# Patient Record
Sex: Female | Born: 1955
Health system: Southern US, Community
[De-identification: ages and names within clinical notes are randomized; demographics above are authoritative.]

## PROBLEM LIST (undated history)

## (undated) DIAGNOSIS — E7800 Pure hypercholesterolemia, unspecified: Secondary | ICD-10-CM

## (undated) DIAGNOSIS — J302 Other seasonal allergic rhinitis: Secondary | ICD-10-CM

## (undated) DIAGNOSIS — F329 Major depressive disorder, single episode, unspecified: Secondary | ICD-10-CM

## (undated) DIAGNOSIS — F419 Anxiety disorder, unspecified: Secondary | ICD-10-CM

## (undated) DIAGNOSIS — G4700 Insomnia, unspecified: Secondary | ICD-10-CM

## (undated) DIAGNOSIS — F32A Depression, unspecified: Secondary | ICD-10-CM

## (undated) HISTORY — PX: WRIST SURGERY: SHX841

## (undated) HISTORY — PX: ABDOMINAL HYSTERECTOMY: SHX81

## (undated) HISTORY — DX: Depression, unspecified: F32.A

## (undated) HISTORY — PX: COLONOSCOPY: SHX174

## (undated) HISTORY — DX: Anxiety disorder, unspecified: F41.9

## (undated) HISTORY — PX: REFRACTIVE SURGERY: SHX103

## (undated) HISTORY — DX: Pure hypercholesterolemia, unspecified: E78.00

## (undated) HISTORY — DX: Insomnia, unspecified: G47.00

## (undated) HISTORY — PX: BREAST EXCISIONAL BIOPSY: SUR124

## (undated) HISTORY — DX: Major depressive disorder, single episode, unspecified: F32.9

## (undated) HISTORY — PX: OTHER SURGICAL HISTORY: SHX169

---

## 1997-12-14 ENCOUNTER — Other Ambulatory Visit: Admission: RE | Admit: 1997-12-14 | Discharge: 1997-12-14 | Payer: Self-pay | Admitting: Obstetrics and Gynecology

## 1998-05-08 ENCOUNTER — Ambulatory Visit (HOSPITAL_COMMUNITY): Admission: RE | Admit: 1998-05-08 | Discharge: 1998-05-08 | Payer: Self-pay | Admitting: Surgery

## 1998-12-19 ENCOUNTER — Other Ambulatory Visit: Admission: RE | Admit: 1998-12-19 | Discharge: 1998-12-19 | Payer: Self-pay | Admitting: *Deleted

## 1999-12-25 ENCOUNTER — Other Ambulatory Visit: Admission: RE | Admit: 1999-12-25 | Discharge: 1999-12-25 | Payer: Self-pay | Admitting: Obstetrics & Gynecology

## 2000-01-07 ENCOUNTER — Encounter: Payer: Self-pay | Admitting: Obstetrics & Gynecology

## 2000-01-07 ENCOUNTER — Encounter: Admission: RE | Admit: 2000-01-07 | Discharge: 2000-01-07 | Payer: Self-pay | Admitting: Obstetrics & Gynecology

## 2001-01-01 ENCOUNTER — Other Ambulatory Visit: Admission: RE | Admit: 2001-01-01 | Discharge: 2001-01-01 | Payer: Self-pay | Admitting: Obstetrics & Gynecology

## 2001-01-12 ENCOUNTER — Encounter: Payer: Self-pay | Admitting: Obstetrics & Gynecology

## 2001-01-12 ENCOUNTER — Encounter: Admission: RE | Admit: 2001-01-12 | Discharge: 2001-01-12 | Payer: Self-pay | Admitting: Obstetrics & Gynecology

## 2002-01-04 ENCOUNTER — Other Ambulatory Visit: Admission: RE | Admit: 2002-01-04 | Discharge: 2002-01-04 | Payer: Self-pay | Admitting: Obstetrics & Gynecology

## 2002-01-12 ENCOUNTER — Ambulatory Visit (HOSPITAL_COMMUNITY): Admission: RE | Admit: 2002-01-12 | Discharge: 2002-01-12 | Payer: Self-pay | Admitting: Family Medicine

## 2002-01-12 ENCOUNTER — Encounter: Payer: Self-pay | Admitting: Family Medicine

## 2002-01-14 ENCOUNTER — Encounter: Payer: Self-pay | Admitting: Obstetrics & Gynecology

## 2002-01-14 ENCOUNTER — Encounter: Admission: RE | Admit: 2002-01-14 | Discharge: 2002-01-14 | Payer: Self-pay | Admitting: Obstetrics & Gynecology

## 2002-03-23 ENCOUNTER — Ambulatory Visit (HOSPITAL_COMMUNITY): Admission: RE | Admit: 2002-03-23 | Discharge: 2002-03-23 | Payer: Self-pay | Admitting: Internal Medicine

## 2002-08-31 ENCOUNTER — Ambulatory Visit (HOSPITAL_COMMUNITY): Admission: RE | Admit: 2002-08-31 | Discharge: 2002-08-31 | Payer: Self-pay | Admitting: Family Medicine

## 2002-08-31 ENCOUNTER — Encounter: Payer: Self-pay | Admitting: Family Medicine

## 2003-01-17 ENCOUNTER — Encounter: Admission: RE | Admit: 2003-01-17 | Discharge: 2003-01-17 | Payer: Self-pay | Admitting: Obstetrics & Gynecology

## 2003-01-17 ENCOUNTER — Encounter: Payer: Self-pay | Admitting: Obstetrics & Gynecology

## 2003-01-23 ENCOUNTER — Other Ambulatory Visit: Admission: RE | Admit: 2003-01-23 | Discharge: 2003-01-23 | Payer: Self-pay | Admitting: Obstetrics & Gynecology

## 2003-08-11 ENCOUNTER — Encounter: Admission: RE | Admit: 2003-08-11 | Discharge: 2003-08-11 | Payer: Self-pay | Admitting: Family Medicine

## 2004-01-30 ENCOUNTER — Encounter: Admission: RE | Admit: 2004-01-30 | Discharge: 2004-01-30 | Payer: Self-pay | Admitting: Obstetrics & Gynecology

## 2004-02-12 ENCOUNTER — Encounter: Admission: RE | Admit: 2004-02-12 | Discharge: 2004-02-12 | Payer: Self-pay | Admitting: Obstetrics & Gynecology

## 2004-05-15 ENCOUNTER — Ambulatory Visit (HOSPITAL_COMMUNITY): Admission: RE | Admit: 2004-05-15 | Discharge: 2004-05-15 | Payer: Self-pay | Admitting: Internal Medicine

## 2004-08-06 ENCOUNTER — Ambulatory Visit (HOSPITAL_COMMUNITY): Admission: RE | Admit: 2004-08-06 | Discharge: 2004-08-06 | Payer: Self-pay | Admitting: Family Medicine

## 2005-01-31 ENCOUNTER — Encounter: Admission: RE | Admit: 2005-01-31 | Discharge: 2005-01-31 | Payer: Self-pay | Admitting: Obstetrics & Gynecology

## 2006-03-02 ENCOUNTER — Encounter: Admission: RE | Admit: 2006-03-02 | Discharge: 2006-03-02 | Payer: Self-pay | Admitting: Obstetrics & Gynecology

## 2006-12-02 ENCOUNTER — Encounter (INDEPENDENT_AMBULATORY_CARE_PROVIDER_SITE_OTHER): Payer: Self-pay | Admitting: Urology

## 2006-12-02 ENCOUNTER — Ambulatory Visit (HOSPITAL_BASED_OUTPATIENT_CLINIC_OR_DEPARTMENT_OTHER): Admission: RE | Admit: 2006-12-02 | Discharge: 2006-12-02 | Payer: Self-pay | Admitting: Urology

## 2007-03-10 ENCOUNTER — Encounter: Admission: RE | Admit: 2007-03-10 | Discharge: 2007-03-10 | Payer: Self-pay | Admitting: Obstetrics & Gynecology

## 2007-03-18 ENCOUNTER — Encounter: Admission: RE | Admit: 2007-03-18 | Discharge: 2007-03-18 | Payer: Self-pay | Admitting: Obstetrics & Gynecology

## 2007-04-26 ENCOUNTER — Encounter (HOSPITAL_COMMUNITY): Admission: RE | Admit: 2007-04-26 | Discharge: 2007-05-26 | Payer: Self-pay | Admitting: Family Medicine

## 2007-08-27 ENCOUNTER — Encounter: Admission: RE | Admit: 2007-08-27 | Discharge: 2007-08-27 | Payer: Self-pay | Admitting: Obstetrics & Gynecology

## 2008-03-13 ENCOUNTER — Encounter: Admission: RE | Admit: 2008-03-13 | Discharge: 2008-03-13 | Payer: Self-pay | Admitting: Obstetrics and Gynecology

## 2008-07-01 IMAGING — NM NM HEPATO W/GB/PHARM/[PERSON_NAME]
2 series · 12 of 12 positions shown · non-contrast
Comparison: none

HISTORY: Abdominal pain

[Series 1: hepatobiliary · 3.20mm/px · 6 of 60 frames shown (1 of 2)]
[frame 6/60]
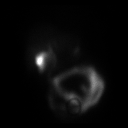
[frame 16/60]
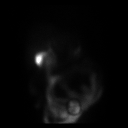
[frame 26/60]
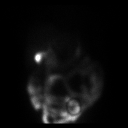
[frame 36/60]
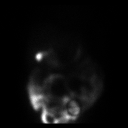
[frame 46/60]
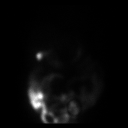
[frame 56/60]
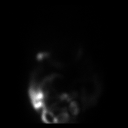

[Series 1: hepatobiliary · 3.20mm/px · 6 of 60 frames shown (2 of 2)]
[frame 6/60]
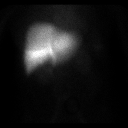
[frame 16/60]
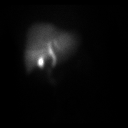
[frame 26/60]
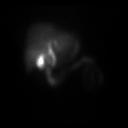
[frame 36/60]
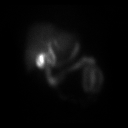
[frame 46/60]
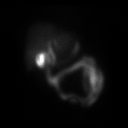
[frame 56/60]
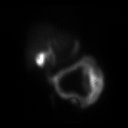

[12 of 12 positions shown; findings below may reference images not displayed]

HEPATOBILIARY SCAN WITH EJECTION FRACTION:

Hepatobiliary imaging performed using 5 mCi 3c-LLm mebrofenin.

Prompt tracer extraction from blood stream, indicating normal hepatocellular
function.
Prompt excretion of tracer into biliary tree.
Gallbladder visualized x 11 minutes.
Small bowel activity identified x 21 minutes.
No focal hepatic retention of tracer.
Mild duodenogastric reflux of tracer.

At 1 hour, patient ingested half-and-half, and imaging was continued for 60
minutes.
Normal emptying of tracer occurs from gallbladder following fatty meal
stimulation.
Calculated gallbladder ejection fraction 72%, normal.
IMPRESSION: Patent biliary tree.
Normal gallbladder ejection fraction of 72% following fatty meal stimulation.
Duodenogastric reflux of bile.

## 2009-03-14 ENCOUNTER — Encounter: Admission: RE | Admit: 2009-03-14 | Discharge: 2009-03-14 | Payer: Self-pay | Admitting: Obstetrics and Gynecology

## 2010-02-05 ENCOUNTER — Ambulatory Visit (HOSPITAL_COMMUNITY): Admission: RE | Admit: 2010-02-05 | Discharge: 2010-02-05 | Payer: Self-pay | Admitting: Urology

## 2010-03-19 ENCOUNTER — Encounter: Admission: RE | Admit: 2010-03-19 | Discharge: 2010-03-19 | Payer: Self-pay | Admitting: Obstetrics and Gynecology

## 2010-06-16 ENCOUNTER — Encounter: Payer: Self-pay | Admitting: Obstetrics & Gynecology

## 2010-10-08 NOTE — Op Note (Signed)
Kelly Scott, Kelly Scott               ACCOUNT NO.:  [id number]   MEDICAL RECORD NO.:  [id number]          PATIENT TYPE:  AMB   LOCATION:  [facility]                         FACILITY:  [facility]   PHYSICIAN:  Ronald L. Earlene Plater, M.D.  DATE OF BIRTH:  02-Feb-1956   DATE OF PROCEDURE:  12/02/2006  DATE OF DISCHARGE:                               OPERATIVE REPORT   PREOPERATIVE DIAGNOSIS:  Questionable interstitial cystitis,  preoperatively.   POSTOPERATIVE DIAGNOSIS:  Probable interstitial cystitis plus  urethritis.   PROCEDURE:  Cystourethroscopy, urethral dilatation, hydraulic bladder  distension, and bladder biopsy.   SURGEON:  Lucrezia Starch. Earlene Plater, M.D.   ANESTHESIA:  LMA.   BLOOD LOSS:  Negligible.   TUBES:  None.   FINDINGS:  She had a capacity of 650 mL at 80 cmH2O pressure and  glomerulations noted.  She also had a tight urethritis.   INDICATIONS FOR PROCEDURE:  Kelly Scott is a lovely 55 year old white  female who basically presents with progressive urgency, frequency,  dysuria, back pain and decreased energy. She has undergone a workup but  with her progressive symptoms we have been quite suspicious that she has  interstitial cystitis.  She has been on Pyridium without much relief and  is now currently on Urel and after understanding risks, benefits and  alternatives, has elected proceed the above procedure.   PROCEDURE IN DETAIL:  The patient was placed in the supine position,  proper LMA anesthesia, was placed in the dorsal lithotomy position,  prepped and draped with Betadine in a sterile fashion.  The urethra was  noted to be quite tight but it was calibrated to 90 [place] with female  sounds.  Cystourethroscopy was performed.  The bladder was smooth walled  and efflux of clear urine was noted from normally placed ureteral  orifices bilaterally.  There was significant circumferential urethritis.  Hydraulic bladder distension was then performed to 80 cmH2O.  She had a  capacity of 650  mL and when it was relieved, there was some slight  bleeding on the right side of the bladder and mainly submucosal  glomerulations.  Biopsies were taken from the posterior midline, cold  cup, and submitted to  pathology.  The base was cauterized with Bovie coagulation cautery.  Reinspection revealed there were no further lesions.  The bladder was  drained.  The panendoscope was removed.  The patient was taken to the  recovery room stable.      Ronald L. Earlene Plater, M.D.  Electronically Signed     RLD/MEDQ  D:  12/02/2006  T:  12/03/2006  Job:  [id number]

## 2011-02-12 ENCOUNTER — Other Ambulatory Visit: Payer: Self-pay | Admitting: Obstetrics & Gynecology

## 2011-02-12 DIAGNOSIS — Z1231 Encounter for screening mammogram for malignant neoplasm of breast: Secondary | ICD-10-CM

## 2011-03-11 ENCOUNTER — Encounter (INDEPENDENT_AMBULATORY_CARE_PROVIDER_SITE_OTHER): Payer: Self-pay | Admitting: General Surgery

## 2011-03-11 LAB — POCT HEMOGLOBIN-HEMACUE
Hemoglobin: 13
Operator id: 114531

## 2011-03-25 ENCOUNTER — Ambulatory Visit: Payer: Self-pay

## 2011-04-15 ENCOUNTER — Ambulatory Visit
Admission: RE | Admit: 2011-04-15 | Discharge: 2011-04-15 | Disposition: A | Discharge status: Home / Self Care | Payer: 59 | Source: Ambulatory Visit | Attending: Obstetrics & Gynecology | Admitting: Obstetrics & Gynecology

## 2011-04-15 DIAGNOSIS — Z1231 Encounter for screening mammogram for malignant neoplasm of breast: Secondary | ICD-10-CM

## 2011-05-13 ENCOUNTER — Other Ambulatory Visit: Payer: Self-pay | Admitting: Dermatology

## 2011-08-04 ENCOUNTER — Encounter (INDEPENDENT_AMBULATORY_CARE_PROVIDER_SITE_OTHER): Payer: Self-pay | Admitting: General Surgery

## 2011-09-08 ENCOUNTER — Other Ambulatory Visit (HOSPITAL_COMMUNITY): Payer: Self-pay | Admitting: Cardiovascular Disease

## 2011-09-08 DIAGNOSIS — R7989 Other specified abnormal findings of blood chemistry: Secondary | ICD-10-CM

## 2011-09-08 DIAGNOSIS — R945 Abnormal results of liver function studies: Secondary | ICD-10-CM

## 2011-09-15 ENCOUNTER — Ambulatory Visit (HOSPITAL_COMMUNITY)
Admission: RE | Admit: 2011-09-15 | Discharge: 2011-09-15 | Disposition: A | Discharge status: Home / Self Care | Payer: 59 | Source: Ambulatory Visit | Attending: Cardiovascular Disease | Admitting: Cardiovascular Disease

## 2011-09-15 DIAGNOSIS — R748 Abnormal levels of other serum enzymes: Secondary | ICD-10-CM | POA: Insufficient documentation

## 2011-09-15 DIAGNOSIS — R945 Abnormal results of liver function studies: Secondary | ICD-10-CM

## 2011-09-15 DIAGNOSIS — K769 Liver disease, unspecified: Secondary | ICD-10-CM | POA: Insufficient documentation

## 2011-11-04 ENCOUNTER — Encounter (INDEPENDENT_AMBULATORY_CARE_PROVIDER_SITE_OTHER): Payer: Self-pay | Admitting: General Surgery

## 2012-03-05 ENCOUNTER — Other Ambulatory Visit: Payer: Self-pay | Admitting: Obstetrics & Gynecology

## 2012-03-05 DIAGNOSIS — Z1231 Encounter for screening mammogram for malignant neoplasm of breast: Secondary | ICD-10-CM

## 2012-04-08 ENCOUNTER — Other Ambulatory Visit: Payer: Self-pay | Admitting: Dermatology

## 2012-04-19 ENCOUNTER — Ambulatory Visit
Admission: RE | Admit: 2012-04-19 | Discharge: 2012-04-19 | Disposition: A | Discharge status: Home / Self Care | Payer: 59 | Source: Ambulatory Visit | Attending: Obstetrics & Gynecology | Admitting: Obstetrics & Gynecology

## 2012-04-19 DIAGNOSIS — Z1231 Encounter for screening mammogram for malignant neoplasm of breast: Secondary | ICD-10-CM

## 2012-04-21 ENCOUNTER — Ambulatory Visit: Payer: 59

## 2012-11-17 ENCOUNTER — Other Ambulatory Visit: Payer: Self-pay | Admitting: Dermatology

## 2012-12-04 ENCOUNTER — Encounter: Payer: Self-pay | Admitting: Nurse Practitioner

## 2012-12-08 ENCOUNTER — Ambulatory Visit: Payer: Self-pay | Admitting: Nurse Practitioner

## 2013-03-08 ENCOUNTER — Encounter: Payer: Self-pay | Admitting: Nurse Practitioner

## 2013-03-08 ENCOUNTER — Ambulatory Visit (INDEPENDENT_AMBULATORY_CARE_PROVIDER_SITE_OTHER): Payer: 59 | Admitting: Nurse Practitioner

## 2013-03-08 VITALS — BP 109/72 | HR 86 | Ht 63.0 in | Wt 163.0 lb

## 2013-03-08 DIAGNOSIS — G4700 Insomnia, unspecified: Secondary | ICD-10-CM | POA: Insufficient documentation

## 2013-03-08 NOTE — Patient Instructions (Signed)
Patient to continue Seroquel Does not need refills today Followup yearly and prn

## 2013-03-08 NOTE — Progress Notes (Signed)
[facility]  PATIENT: Kelly Scott DOB: 1955-06-22   REASON FOR VISIT: Followup for insomnia   HISTORY OF PRESENT ILLNESS: Kelly Scott, 57 year old  caucasian female  who lost her son in 2008, and since  has insomnia. She was last seen 06/04/12.  She can fall asleep easily but cannot stay asleep.  Stress and grief having had an overall detremental effect on her sleep.  She has high triglycerides and gained weight since 2008, going  along with insomnia. Liver enzymes are of concern, too. She has tried melatonin and Valium,  Lunesta and Ambien in the past all of which worked well for a while but lost their effectiveness. She was placed on Seroquel at her last visit in January and continues to do well with that medication. She is taking one half of 25 mg and sleeping 8-10hours.     REVIEW OF SYSTEMS: Full 14 system review of systems performed and notable only for:  Constitutional: N/A  Cardiovascular: N/A  Ear/Nose/Throat: N/A  Skin: N/A  Eyes: N/A  Respiratory: N/A  Gastroitestinal: N/A  Hematology/Lymphatic: N/A  Endocrine: N/A Musculoskeletal:N/A  Allergy/Immunology: N/A  Neurological: N/A Psychiatric: N/A   ALLERGIES: Allergies  Allergen Reactions  . Augmentin [Amoxicillin-Pot Clavulanate]   . Codeine     HOME MEDICATIONS: Outpatient Prescriptions Prior to Visit  Medication Sig Dispense Refill  . estradiol (VIVELLE-DOT) 0.075 MG/24HR Place 1 patch onto the skin 2 (two) times a week.      Marland Kitchen QUEtiapine (SEROQUEL) 25 MG tablet Take 25 mg by mouth at bedtime.      . diazepam (VALIUM) 5 MG tablet Take 5 mg by mouth 3 (three) times daily.       No facility-administered medications prior to visit.    PAST MEDICAL HISTORY: Past Medical History  Diagnosis Date  . Depression   . Anxiety   . Insomnia     PAST SURGICAL HISTORY: Past Surgical History  Procedure Laterality Date  . Cesarean section      FAMILY HISTORY: Family History  Problem  Relation Age of Onset  . Dementia Mother   . Emphysema Father     SOCIAL HISTORY: History   Social History  . Marital Status: Married    Spouse Name: Brett Canales     Number of Children: 2  . Years of Education: N/A   Occupational History  . Not on file.   Social History Main Topics  . Smoking status: Never Smoker   . Smokeless tobacco: Never Used  . Alcohol Use: No  . Drug Use: No  . Sexual Activity: Not on file   Other Topics Concern  . Not on file   Social History Narrative   Patient is married and lives at home.    Patient has 2 children and one is deceased.    Patient has a high school education.           PHYSICAL EXAM  Filed Vitals:   03/08/13 1402  BP: 109/72  Pulse: 86  Height: 5\' 3"  (1.6 m)  Weight: 163 lb (73.936 kg)   Body mass index is 28.88 kg/(m^2).  Generalized: Well developed, in no acute distress  Neurological examination   Mentation: Alert oriented to time, place, history taking. Follows all commands speech and language fluent  Cranial nerve II-XII: Pupils were equal round reactive to light extraocular movements were full, visual field were full on confrontational test. Facial sensation and strength were normal. hearing was intact to finger rubbing  bilaterally. Uvula tongue midline. head turning and shoulder shrug and were normal and symmetric.Tongue protrusion into cheek strength was normal. Motor: normal bulk and tone, full strength in the BUE, BLE, fine finger movements normal, no pronator drift. No focal weakness Coordination: finger-nose-finger, heel-to-shin bilaterally, no dysmetria Reflexes: 1+ upper lower and symmetric  Gait and Station: Rising up from seated position without assistance, normal stance,  moderate stride, good arm swing, smooth turning, able to perform tiptoe, and heel walking without difficulty. Tandem gait steady  DIAGNOSTIC DATA (LABS, IMAGING, TESTING) - None to review   ASSESSMENT AND PLAN  57 y.o. year old female   has a past medical history of Depression; Anxiety; and Insomnia. here for followup. Currently doing well on Seroquel  Patient to continue Seroquel Does not need refills today Followup yearly and prn Nilda Riggs, [facility], [facility], APRN  [facility] [address], [address] [place], [place] [address] [phone]

## 2013-03-16 ENCOUNTER — Other Ambulatory Visit: Payer: Self-pay

## 2013-03-16 DIAGNOSIS — Z1231 Encounter for screening mammogram for malignant neoplasm of breast: Secondary | ICD-10-CM

## 2013-04-25 ENCOUNTER — Ambulatory Visit
Admission: RE | Admit: 2013-04-25 | Discharge: 2013-04-25 | Disposition: A | Discharge status: Home / Self Care | Payer: 59 | Source: Ambulatory Visit

## 2013-04-25 DIAGNOSIS — Z1231 Encounter for screening mammogram for malignant neoplasm of breast: Secondary | ICD-10-CM

## 2013-05-31 ENCOUNTER — Other Ambulatory Visit: Payer: Self-pay

## 2013-05-31 MED ORDER — QUETIAPINE FUMARATE 25 MG PO TABS: 25.0000 mg | ORAL_TABLET | Freq: Every day | ORAL | Status: DC

## 2013-11-21 ENCOUNTER — Telehealth: Payer: Self-pay | Admitting: Neurology

## 2013-11-21 NOTE — Telephone Encounter (Signed)
Called pt and left message informing her to give our office a call back.

## 2013-11-21 NOTE — Telephone Encounter (Signed)
Patient calling to state that she would like to discuss coming off of her Seroquel medication, states that it is too strong. Please return call to patient and advise.

## 2013-11-21 NOTE — Telephone Encounter (Signed)
Its already a tiny pill- can she use a pill cutter and take one half tab first ? Thanks

## 2013-11-21 NOTE — Telephone Encounter (Signed)
Pt called back to inform me that when she takes that medication Seroquel 25 mg, 1 tab at bedtime, it makes her feel in a daze, even after she get up during the day, pt states that she can't concentrate and its affecting her speech. Pt wanted to know about stopping the Seroquel and trying something else, pt states that Seroquel is too strong. Please advise

## 2013-11-22 NOTE — Telephone Encounter (Signed)
Patient called back stating the pharmacist told her that they could not get it in any other form.  Patient needs lower dosage. Please call to advise.

## 2013-11-22 NOTE — Telephone Encounter (Signed)
Called pt to inform her per Dr. Brett Fairy to cut the medication Seroquel 25 mg tab in half, to see if that helps with the side effect. Pt stated that she was taking just a half of the tablet until they gave her a small pill and pt was unable to cut the pill. I advised the pt to talk with her Pharmacist to see if there was another pill that was able to be cut. Pt stated that she would talk with her pharmacist and if she needs to give [place] a call back, she will. FYI

## 2013-11-22 NOTE — Telephone Encounter (Signed)
Try to cut 25 mg tab in a pill cutter , a lot of patients do.

## 2013-11-22 NOTE — Telephone Encounter (Signed)
Please advise previous note.

## 2013-11-28 NOTE — Telephone Encounter (Signed)
I called pt and spoke to her and let her know below recommendations.   She verbalized that she did not need to titrate off seroquel, can try belsomra and let us know how this works.

## 2013-11-28 NOTE — Telephone Encounter (Signed)
Patient states that she has already tried to cut the Seroquel 25 mg with the pill cutter and it doesn't work, the pill crumbles.  Patient states that if there is not another medication that she can take similar to the Seroquel that is not as sedating then she wants to know how to wean herself off the medication.  Please advise.

## 2013-11-28 NOTE — Telephone Encounter (Signed)
Patient can stop Seroquel. Do not need to titrate off. Samples of Belsomra 10mg  left at front desk. Use samples  There is a free trial offer also for 10 pills. Call if the med works.

## 2013-11-28 NOTE — Telephone Encounter (Signed)
Patient returning call to Rachael Darby, please call patient and advise.

## 2014-03-24 ENCOUNTER — Other Ambulatory Visit: Payer: Self-pay

## 2014-03-24 DIAGNOSIS — Z1231 Encounter for screening mammogram for malignant neoplasm of breast: Secondary | ICD-10-CM

## 2014-04-05 ENCOUNTER — Other Ambulatory Visit: Payer: Self-pay | Admitting: Neurology

## 2014-05-02 ENCOUNTER — Ambulatory Visit
Admission: RE | Admit: 2014-05-02 | Discharge: 2014-05-02 | Disposition: A | Discharge status: Home / Self Care | Payer: 59 | Source: Ambulatory Visit

## 2014-05-02 DIAGNOSIS — Z1231 Encounter for screening mammogram for malignant neoplasm of breast: Secondary | ICD-10-CM

## 2014-06-14 ENCOUNTER — Other Ambulatory Visit: Payer: Self-pay | Admitting: Neurology

## 2014-06-27 ENCOUNTER — Encounter: Payer: Self-pay | Admitting: Nurse Practitioner

## 2014-06-27 ENCOUNTER — Ambulatory Visit (INDEPENDENT_AMBULATORY_CARE_PROVIDER_SITE_OTHER): Payer: 59 | Admitting: Nurse Practitioner

## 2014-06-27 VITALS — BP 116/73 | HR 74 | Ht 63.0 in | Wt 160.0 lb

## 2014-06-27 DIAGNOSIS — H9209 Otalgia, unspecified ear: Secondary | ICD-10-CM | POA: Insufficient documentation

## 2014-06-27 DIAGNOSIS — G4700 Insomnia, unspecified: Secondary | ICD-10-CM

## 2014-06-27 DIAGNOSIS — H9201 Otalgia, right ear: Secondary | ICD-10-CM

## 2014-06-27 MED ORDER — QUETIAPINE FUMARATE 25 MG PO TABS: 25.0000 mg | ORAL_TABLET | Freq: Every day | ORAL | Status: DC

## 2014-06-27 NOTE — Progress Notes (Signed)
I agree with the assessment and plan as directed by NP .The patient is known to me .   Camar Guyton, MD  

## 2014-06-27 NOTE — Patient Instructions (Signed)
Continue Seroquel at current dose Follow-up yearly and when necessary

## 2014-06-27 NOTE — Progress Notes (Signed)
[facility]  PATIENT: Kelly Scott DOB: [birth year]   REASON FOR VISIT: Follow-up for insomnia  HISTORY FROM: Patient    HISTORY OF PRESENT ILLNESS:Kelly Scott, 59 year old caucasian female who lost her son in 2008, and since has insomnia. She was last seen 03/08/13. She can fall asleep easily but cannot stay asleep. Stress and grief having had an overall detremental effect on her sleep.  She has high triglycerides and gained weight since 2008, going along with insomnia. Liver enzymes are of concern, too. She has tried melatonin and Valium, Lunesta and Ambien in the past all of which worked well for a while but lost their effectiveness. She was placed on Seroquel at her  January 2014 and continues to do well with that medication. She is taking one half of 25 mg and sleeping at least 8 hours. She needs refills she returns for reevaluation. She complains of ear pain   REVIEW OF SYSTEMS: Full 14 system review of systems performed and notable only for those listed, all others are neg:  Constitutional: neg  Cardiovascular: neg Ear/Nose/Throat: Ear pain Skin: neg Eyes: neg Respiratory: neg Gastroitestinal: neg  Hematology/Lymphatic: neg  Endocrine: neg Musculoskeletal:neg Allergy/Immunology: neg Neurological: neg Psychiatric: neg Sleep : neg   ALLERGIES: Allergies  Allergen Reactions  . Augmentin [Amoxicillin-Pot Clavulanate]   . Codeine     HOME MEDICATIONS: Outpatient Prescriptions Prior to Visit  Medication Sig Dispense Refill  . estradiol (VIVELLE-DOT) 0.075 MG/24HR Place 1 patch onto the skin 2 (two) times a week.    . hydrOXYzine (ATARAX/VISTARIL) 25 MG tablet     . Meth-Hyo-M Bl-Na Phos-Ph Sal (URIBEL) 118 MG CAPS as needed.     Marland Kitchen levocetirizine (XYZAL) 5 MG tablet     . omega-3 acid ethyl esters (LOVAZA) 1 G capsule     . QUEtiapine (SEROQUEL) 25 MG tablet TAKE ONE TABLET BY MOUTH AT BEDTIME. 30 tablet 0  . ranitidine (ZANTAC) 150 MG  tablet     . Suvorexant (BELSOMRA) 10 MG TABS Take 10 mg by mouth at bedtime. Take 30 minutes before bed. Do not take if you cannot sleep at least 7 hours     No facility-administered medications prior to visit.    PAST MEDICAL HISTORY: Past Medical History  Diagnosis Date  . Depression   . Anxiety   . Insomnia     PAST SURGICAL HISTORY: Past Surgical History  Procedure Laterality Date  . Cesarean section      FAMILY HISTORY: Family History  Problem Relation Age of Onset  . Dementia Mother   . Emphysema Father     SOCIAL HISTORY: History   Social History  . Marital Status: Married    Spouse Name: Richardson Landry     Number of Children: 2  . Years of Education: N/A   Occupational History  . Not on file.   Social History Main Topics  . Smoking status: Never Smoker   . Smokeless tobacco: Never Used  . Alcohol Use: No  . Drug Use: No  . Sexual Activity: Not on file   Other Topics Concern  . Not on file   Social History Narrative   Patient is married and lives at home.    Patient has 2 children and one is deceased.    Patient has a high school education.           PHYSICAL EXAM  Filed Vitals:   06/27/14 1425  BP: 116/73  Pulse: 74  Height: 5'  3" (1.6 m)  Weight: 160 lb (72.576 kg)   Body mass index is 28.35 kg/(m^2). Generalized: Well developed, in no acute distress  Ears bilateral cerumen Neurological examination   Mentation: Alert oriented to time, place, history taking. Follows all commands speech and language fluent  Cranial nerve II-XII: Pupils were equal round reactive to light extraocular movements were full, visual field were full on confrontational test. Facial sensation and strength were normal. hearing was intact to finger rubbing bilaterally. Uvula tongue midline. head turning and shoulder shrug and were normal and symmetric.Tongue protrusion into cheek strength was normal. Motor: normal bulk and tone, full strength in the BUE, BLE, fine finger  movements normal, no pronator drift. No focal weakness Coordination: finger-nose-finger, heel-to-shin bilaterally, no dysmetria Reflexes: 1+ upper lower and symmetric  Gait and Station: Rising up from seated position without assistance, normal stance, moderate stride, good arm swing, smooth turning, able to perform tiptoe, and heel walking without difficulty. Tandem gait steady DIAGNOSTIC DATA (LABS, IMAGING, TESTING) - ASSESSMENT AND PLAN   59 y.o. year old female  has a past medical history of Depression; Anxiety; and Insomnia. here to follow up. Seroquel 12.5 mg has been beneficial, she can sleep at least 8 hours.  Continue Seroquel at current dose Follow-up yearly and when necessary Use over-the-counter Debrox for cerumen removal Dennie Bible, [facility], [facility], APRN  [facility] [address], [place], [place] [address] [phone]

## 2015-07-03 ENCOUNTER — Ambulatory Visit: Payer: 59 | Admitting: Nurse Practitioner

## 2015-08-02 ENCOUNTER — Other Ambulatory Visit (HOSPITAL_COMMUNITY): Payer: Self-pay | Admitting: Internal Medicine

## 2015-08-02 ENCOUNTER — Ambulatory Visit (HOSPITAL_COMMUNITY)
Admission: RE | Admit: 2015-08-02 | Discharge: 2015-08-02 | Disposition: A | Discharge status: Home / Self Care | Payer: 59 | Source: Ambulatory Visit | Attending: Internal Medicine | Admitting: Internal Medicine

## 2015-08-02 DIAGNOSIS — K5792 Diverticulitis of intestine, part unspecified, without perforation or abscess without bleeding: Secondary | ICD-10-CM | POA: Diagnosis not present

## 2015-08-02 DIAGNOSIS — K760 Fatty (change of) liver, not elsewhere classified: Secondary | ICD-10-CM | POA: Diagnosis not present

## 2015-08-02 DIAGNOSIS — R161 Splenomegaly, not elsewhere classified: Secondary | ICD-10-CM

## 2015-08-02 LAB — POCT I-STAT CREATININE: CREATININE: 0.8 mg/dL (ref 0.44–1.00)

## 2015-08-02 MED ORDER — IOHEXOL 300 MG/ML  SOLN
100.0000 mL | Freq: Once | INTRAMUSCULAR | Status: AC | PRN
  Administered 2015-08-02: 100 mL via INTRAVENOUS

## 2015-10-04 ENCOUNTER — Encounter (INDEPENDENT_AMBULATORY_CARE_PROVIDER_SITE_OTHER): Payer: Self-pay | Admitting: *Deleted

## 2015-10-30 ENCOUNTER — Other Ambulatory Visit (INDEPENDENT_AMBULATORY_CARE_PROVIDER_SITE_OTHER): Payer: Self-pay | Admitting: Internal Medicine

## 2015-10-30 ENCOUNTER — Encounter (INDEPENDENT_AMBULATORY_CARE_PROVIDER_SITE_OTHER): Payer: Self-pay | Admitting: *Deleted

## 2015-10-30 ENCOUNTER — Ambulatory Visit (INDEPENDENT_AMBULATORY_CARE_PROVIDER_SITE_OTHER): Payer: 59 | Admitting: Internal Medicine

## 2015-10-30 ENCOUNTER — Encounter (INDEPENDENT_AMBULATORY_CARE_PROVIDER_SITE_OTHER): Payer: Self-pay | Admitting: Internal Medicine

## 2015-10-30 VITALS — BP 124/50 | HR 60 | Temp 98.1°F | Ht 63.0 in | Wt 160.7 lb

## 2015-10-30 DIAGNOSIS — N3090 Cystitis, unspecified without hematuria: Secondary | ICD-10-CM

## 2015-10-30 DIAGNOSIS — E7800 Pure hypercholesterolemia, unspecified: Secondary | ICD-10-CM | POA: Insufficient documentation

## 2015-10-30 DIAGNOSIS — K5732 Diverticulitis of large intestine without perforation or abscess without bleeding: Secondary | ICD-10-CM

## 2015-10-30 NOTE — Progress Notes (Signed)
   Subjective:    Patient ID: Kelly Scott, female    DOB: 1955-12-23, 60 y.o.   MRN: [id number]  HPI Hx of Diverticulitis.  Hx x 2 bouts. Last episode in March.  Her last colonoscopy was possible 10 yrs ago.  Appetite is good. No weight loss. No abdominal pain.  BMs are normal. Usually has one a day.  Last colonoscopy was about 10 yrs ago.    08/02/2015 CT abdomen/pelvis with CM:  abdominal pain: IMPRESSION: 1. Acute uncomplicated diverticulitis of the mid descending colon. No abscess. 2. Mild splenomegaly, this appears chronic and unchanged from prior exam. 3. Hepatic steatosis. These results will be called to the ordering clinician or representative by the Radiologist Assistant, and communication documented in the PACS or zVision Dashboard.  Review of Systems Past Medical History  Diagnosis Date  . Depression   . Anxiety   . Insomnia   . High cholesterol     Past Surgical History  Procedure Laterality Date  . Cesarean section      Allergies  Allergen Reactions  . Augmentin [Amoxicillin-Pot Clavulanate]   . Codeine     Current Outpatient Prescriptions on File Prior to Visit  Medication Sig Dispense Refill  . Meth-Hyo-M Bl-Na Phos-Ph Sal (URIBEL) 118 MG CAPS as needed.      No current facility-administered medications on file prior to visit.        Objective:   Physical Exam  Blood pressure 124/50, pulse 60, temperature 98.1 F (36.7 C), height 5\' 3"  (1.6 m), weight 160 lb 11.2 oz (72.893 kg).   Alert and oriented. Skin warm and dry. Oral mucosa is moist.   . Sclera anicteric, conjunctivae is pink. Thyroid not enlarged. No cervical lymphadenopathy. Lungs clear. Heart regular rate and rhythm.  Abdomen is soft. Bowel sounds are positive. No hepatomegaly. No abdominal masses felt. No tenderness.  No edema to lower extremities.        Assessment & Plan:  Hx of diverticulitis x 2. Her last colonoscopy was at 24. Needs screening.  Colonoscopy. The risks and  benefits such as perforation, bleeding, and infection were reviewed with the patient and is agreeable.

## 2015-10-30 NOTE — Patient Instructions (Signed)
The risks and benefits such as perforation, bleeding, and infection were reviewed with the patient and is agreeable. 

## 2015-10-30 NOTE — Telephone Encounter (Signed)
This encounter was created in error - please disregard.

## 2016-01-02 ENCOUNTER — Encounter (HOSPITAL_COMMUNITY): Admission: RE | Payer: Self-pay | Source: Ambulatory Visit

## 2016-01-02 ENCOUNTER — Ambulatory Visit (HOSPITAL_COMMUNITY): Admission: RE | Admit: 2016-01-02 | Payer: 59 | Source: Ambulatory Visit | Admitting: Internal Medicine

## 2016-01-02 SURGERY — COLONOSCOPY
Anesthesia: Moderate Sedation

## 2016-07-01 DIAGNOSIS — R109 Unspecified abdominal pain: Secondary | ICD-10-CM | POA: Diagnosis not present

## 2016-07-01 DIAGNOSIS — Z01419 Encounter for gynecological examination (general) (routine) without abnormal findings: Secondary | ICD-10-CM | POA: Diagnosis not present

## 2016-07-04 DIAGNOSIS — N3010 Interstitial cystitis (chronic) without hematuria: Secondary | ICD-10-CM | POA: Diagnosis not present

## 2016-10-07 DIAGNOSIS — D485 Neoplasm of uncertain behavior of skin: Secondary | ICD-10-CM | POA: Diagnosis not present

## 2016-10-07 DIAGNOSIS — L821 Other seborrheic keratosis: Secondary | ICD-10-CM | POA: Diagnosis not present

## 2016-10-17 DIAGNOSIS — R232 Flushing: Secondary | ICD-10-CM | POA: Diagnosis not present

## 2016-11-04 DIAGNOSIS — R5383 Other fatigue: Secondary | ICD-10-CM | POA: Diagnosis not present

## 2016-11-04 DIAGNOSIS — R7301 Impaired fasting glucose: Secondary | ICD-10-CM | POA: Diagnosis not present

## 2016-11-04 DIAGNOSIS — E782 Mixed hyperlipidemia: Secondary | ICD-10-CM | POA: Diagnosis not present

## 2016-11-06 DIAGNOSIS — E782 Mixed hyperlipidemia: Secondary | ICD-10-CM | POA: Diagnosis not present

## 2016-11-06 DIAGNOSIS — R945 Abnormal results of liver function studies: Secondary | ICD-10-CM | POA: Diagnosis not present

## 2016-11-06 DIAGNOSIS — R944 Abnormal results of kidney function studies: Secondary | ICD-10-CM | POA: Diagnosis not present

## 2016-12-02 DIAGNOSIS — H524 Presbyopia: Secondary | ICD-10-CM | POA: Diagnosis not present

## 2017-01-20 DIAGNOSIS — N3010 Interstitial cystitis (chronic) without hematuria: Secondary | ICD-10-CM | POA: Diagnosis not present

## 2017-02-03 DIAGNOSIS — J302 Other seasonal allergic rhinitis: Secondary | ICD-10-CM | POA: Diagnosis not present

## 2017-02-03 DIAGNOSIS — H6123 Impacted cerumen, bilateral: Secondary | ICD-10-CM | POA: Diagnosis not present

## 2017-02-03 DIAGNOSIS — J342 Deviated nasal septum: Secondary | ICD-10-CM | POA: Diagnosis not present

## 2017-02-03 DIAGNOSIS — J343 Hypertrophy of nasal turbinates: Secondary | ICD-10-CM | POA: Diagnosis not present

## 2017-02-09 DIAGNOSIS — N3010 Interstitial cystitis (chronic) without hematuria: Secondary | ICD-10-CM | POA: Diagnosis not present

## 2017-03-12 DIAGNOSIS — R7301 Impaired fasting glucose: Secondary | ICD-10-CM | POA: Diagnosis not present

## 2017-03-12 DIAGNOSIS — E782 Mixed hyperlipidemia: Secondary | ICD-10-CM | POA: Diagnosis not present

## 2017-03-16 DIAGNOSIS — E782 Mixed hyperlipidemia: Secondary | ICD-10-CM | POA: Diagnosis not present

## 2017-03-24 ENCOUNTER — Other Ambulatory Visit (HOSPITAL_COMMUNITY): Payer: Self-pay | Admitting: Internal Medicine

## 2017-03-24 DIAGNOSIS — Z780 Asymptomatic menopausal state: Secondary | ICD-10-CM

## 2017-03-31 DIAGNOSIS — M79605 Pain in left leg: Secondary | ICD-10-CM | POA: Diagnosis not present

## 2017-04-02 ENCOUNTER — Ambulatory Visit (HOSPITAL_COMMUNITY)
Admission: RE | Admit: 2017-04-02 | Discharge: 2017-04-02 | Disposition: A | Discharge status: Home / Self Care | Payer: 59 | Source: Ambulatory Visit | Attending: Internal Medicine | Admitting: Internal Medicine

## 2017-04-02 DIAGNOSIS — Z13820 Encounter for screening for osteoporosis: Secondary | ICD-10-CM | POA: Diagnosis present

## 2017-04-02 DIAGNOSIS — Z780 Asymptomatic menopausal state: Secondary | ICD-10-CM | POA: Diagnosis not present

## 2017-04-29 ENCOUNTER — Other Ambulatory Visit (INDEPENDENT_AMBULATORY_CARE_PROVIDER_SITE_OTHER): Payer: Self-pay | Admitting: *Deleted

## 2017-04-29 DIAGNOSIS — Z1211 Encounter for screening for malignant neoplasm of colon: Secondary | ICD-10-CM | POA: Insufficient documentation

## 2017-05-01 DIAGNOSIS — Z86018 Personal history of other benign neoplasm: Secondary | ICD-10-CM | POA: Diagnosis not present

## 2017-05-01 DIAGNOSIS — L821 Other seborrheic keratosis: Secondary | ICD-10-CM | POA: Diagnosis not present

## 2017-05-01 DIAGNOSIS — D1800 Hemangioma unspecified site: Secondary | ICD-10-CM | POA: Diagnosis not present

## 2017-06-05 DIAGNOSIS — N3010 Interstitial cystitis (chronic) without hematuria: Secondary | ICD-10-CM | POA: Diagnosis not present

## 2017-06-25 DIAGNOSIS — M79605 Pain in left leg: Secondary | ICD-10-CM | POA: Diagnosis not present

## 2017-06-25 DIAGNOSIS — M5431 Sciatica, right side: Secondary | ICD-10-CM | POA: Diagnosis not present

## 2017-07-24 ENCOUNTER — Telehealth (INDEPENDENT_AMBULATORY_CARE_PROVIDER_SITE_OTHER): Payer: Self-pay | Admitting: *Deleted

## 2017-07-24 ENCOUNTER — Encounter (INDEPENDENT_AMBULATORY_CARE_PROVIDER_SITE_OTHER): Payer: Self-pay | Admitting: *Deleted

## 2017-07-24 NOTE — Telephone Encounter (Signed)
Patient needs trilyte 

## 2017-07-27 MED ORDER — PEG 3350-KCL-NA BICARB-NACL 420 G PO SOLR: 4000.0000 mL | Freq: Once | ORAL | 0 refills | Status: AC

## 2017-07-28 DIAGNOSIS — M545 Low back pain: Secondary | ICD-10-CM | POA: Diagnosis not present

## 2017-07-28 DIAGNOSIS — M79604 Pain in right leg: Secondary | ICD-10-CM | POA: Diagnosis not present

## 2017-07-30 ENCOUNTER — Other Ambulatory Visit: Payer: Self-pay | Admitting: Internal Medicine

## 2017-07-30 DIAGNOSIS — M25551 Pain in right hip: Secondary | ICD-10-CM

## 2017-07-30 DIAGNOSIS — M545 Low back pain: Secondary | ICD-10-CM

## 2017-08-09 ENCOUNTER — Ambulatory Visit
Admission: RE | Admit: 2017-08-09 | Discharge: 2017-08-09 | Disposition: A | Discharge status: Home / Self Care | Payer: 59 | Source: Ambulatory Visit | Attending: Internal Medicine | Admitting: Internal Medicine

## 2017-08-09 DIAGNOSIS — M48061 Spinal stenosis, lumbar region without neurogenic claudication: Secondary | ICD-10-CM | POA: Diagnosis not present

## 2017-08-09 DIAGNOSIS — M545 Low back pain: Secondary | ICD-10-CM

## 2017-08-09 DIAGNOSIS — M25551 Pain in right hip: Secondary | ICD-10-CM

## 2017-08-09 DIAGNOSIS — R102 Pelvic and perineal pain: Secondary | ICD-10-CM | POA: Diagnosis not present

## 2017-08-18 ENCOUNTER — Telehealth (INDEPENDENT_AMBULATORY_CARE_PROVIDER_SITE_OTHER): Payer: Self-pay | Admitting: *Deleted

## 2017-08-18 NOTE — Telephone Encounter (Signed)
agree

## 2017-08-18 NOTE — Telephone Encounter (Signed)
Referring MD/PCP: hall   Procedure: tcs  Reason/Indication:  screening  Has patient had this procedure before?  Yes, 12 yrs ago  If so, when, by whom and where?    Is there a family history of colon cancer?  no  Who?  What age when diagnosed?    Is patient diabetic?   no      Does patient have prosthetic heart valve or mechanical valve?  no  Do you have a pacemaker?  no  Has patient ever had endocarditis? no  Has patient had joint replacement within last 12 months?  no  Is patient constipated or do they take laxatives? no  Does patient have a history of alcohol/drug use?  no  Is patient on blood thinner such as Coumadin, Plavix and/or Aspirin? no  Medications: see epic  Allergies: see epic  Medication Adjustment per Dr Lindi Adie, NP:   Procedure date & time: 09/09/17 at 830

## 2017-09-02 DIAGNOSIS — G5701 Lesion of sciatic nerve, right lower limb: Secondary | ICD-10-CM | POA: Diagnosis not present

## 2017-09-02 DIAGNOSIS — R030 Elevated blood-pressure reading, without diagnosis of hypertension: Secondary | ICD-10-CM | POA: Diagnosis not present

## 2017-09-09 ENCOUNTER — Other Ambulatory Visit: Payer: Self-pay

## 2017-09-09 ENCOUNTER — Encounter (HOSPITAL_COMMUNITY): Payer: Self-pay | Admitting: *Deleted

## 2017-09-09 ENCOUNTER — Ambulatory Visit (HOSPITAL_COMMUNITY)
Admission: RE | Admit: 2017-09-09 | Discharge: 2017-09-09 | Disposition: A | Discharge status: Home / Self Care | Payer: 59 | Source: Ambulatory Visit | Attending: Internal Medicine | Admitting: Internal Medicine

## 2017-09-09 ENCOUNTER — Encounter (HOSPITAL_COMMUNITY)
Admission: RE | Disposition: A | Discharge status: Home / Self Care | Payer: Self-pay | Source: Ambulatory Visit | Attending: Internal Medicine

## 2017-09-09 DIAGNOSIS — K5730 Diverticulosis of large intestine without perforation or abscess without bleeding: Secondary | ICD-10-CM | POA: Diagnosis not present

## 2017-09-09 DIAGNOSIS — Z880 Allergy status to penicillin: Secondary | ICD-10-CM | POA: Diagnosis not present

## 2017-09-09 DIAGNOSIS — Z1211 Encounter for screening for malignant neoplasm of colon: Secondary | ICD-10-CM | POA: Insufficient documentation

## 2017-09-09 DIAGNOSIS — Z79899 Other long term (current) drug therapy: Secondary | ICD-10-CM | POA: Diagnosis not present

## 2017-09-09 DIAGNOSIS — Z885 Allergy status to narcotic agent status: Secondary | ICD-10-CM | POA: Insufficient documentation

## 2017-09-09 DIAGNOSIS — K644 Residual hemorrhoidal skin tags: Secondary | ICD-10-CM | POA: Insufficient documentation

## 2017-09-09 HISTORY — DX: Other seasonal allergic rhinitis: J30.2

## 2017-09-09 HISTORY — PX: COLONOSCOPY: SHX5424

## 2017-09-09 SURGERY — COLONOSCOPY
Anesthesia: Moderate Sedation

## 2017-09-09 MED ORDER — MEPERIDINE HCL 50 MG/ML IJ SOLN
INTRAMUSCULAR | Status: AC
  Filled 2017-09-09: qty 1

## 2017-09-09 MED ORDER — MIDAZOLAM HCL 5 MG/5ML IJ SOLN
INTRAMUSCULAR | Status: AC
  Filled 2017-09-09: qty 10

## 2017-09-09 MED ORDER — STERILE WATER FOR IRRIGATION IR SOLN
Status: DC | PRN
  Administered 2017-09-09: 15 mL

## 2017-09-09 MED ORDER — MEPERIDINE HCL 50 MG/ML IJ SOLN
INTRAMUSCULAR | Status: DC | PRN
  Administered 2017-09-09 (×2): 25 mg via INTRAVENOUS

## 2017-09-09 MED ORDER — SODIUM CHLORIDE 0.9 % IV SOLN
INTRAVENOUS | Status: DC
  Administered 2017-09-09: 08:00:00 via INTRAVENOUS

## 2017-09-09 MED ORDER — MIDAZOLAM HCL 5 MG/5ML IJ SOLN
INTRAMUSCULAR | Status: DC | PRN
  Administered 2017-09-09: 3 mg via INTRAVENOUS
  Administered 2017-09-09: 2 mg via INTRAVENOUS
  Administered 2017-09-09 (×2): 1 mg via INTRAVENOUS
  Administered 2017-09-09: 2 mg via INTRAVENOUS

## 2017-09-09 NOTE — Discharge Instructions (Signed)
Colonoscopy, Adult, Care After This sheet gives you information about how to care for yourself after your procedure. Your doctor may also give you more specific instructions. If you have problems or questions, call your doctor. Follow these instructions at home: General instructions   For the first 24 hours after the procedure: ? Do not drive or use machinery. ? Do not sign important documents. ? Do not drink alcohol. ? Do your daily activities more slowly than normal. ? Eat foods that are soft and easy to digest. ? Rest often.  Take over-the-counter or prescription medicines only as told by your doctor.  It is up to you to get the results of your procedure. Ask your doctor, or the department performing the procedure, when your results will be ready. To help cramping and bloating:  Try walking around.  Put heat on your belly (abdomen) as told by your doctor. Use a heat source that your doctor recommends, such as a moist heat pack or a heating pad. ? Put a towel between your skin and the heat source. ? Leave the heat on for 20-30 minutes. ? Remove the heat if your skin turns bright red. This is especially important if you cannot feel pain, heat, or cold. You can get burned. Eating and drinking  Drink enough fluid to keep your pee (urine) clear or pale yellow.  Return to your normal diet as told by your doctor. Avoid heavy or fried foods that are hard to digest.  Avoid drinking alcohol for as long as told by your doctor. Contact a doctor if:  You have blood in your poop (stool) 2-3 days after the procedure. Get help right away if:  You have more than a small amount of blood in your poop.  You see large clumps of tissue (blood clots) in your poop.  Your belly is swollen.  You feel sick to your stomach (nauseous).  You throw up (vomit).  You have a fever.  You have belly pain that gets worse, and medicine does not help your pain. This information is not intended to  replace advice given to you by your health care provider. Make sure you discuss any questions you have with your health care provider. Document Released: 06/14/2010 Document Revised: 02/04/2016 Document Reviewed: 02/04/2016 Elsevier Interactive Patient Education  2017 [employer]  Diverticulosis Diverticulosis is a condition that develops when small pouches (diverticula) form in the wall of the large intestine (colon). The colon is where water is absorbed and stool is formed. The pouches form when the inside layer of the colon pushes through weak spots in the outer layers of the colon. You may have a few pouches or many of them. What are the causes? The cause of this condition is not known. What increases the risk? The following factors may make you more likely to develop this condition:  Being older than age 30. Your risk for this condition increases with age. Diverticulosis is rare among people younger than age 65. By age 28, many people have it.  Eating a low-fiber diet.  Having frequent constipation.  Being overweight.  Not getting enough exercise.  Smoking.  Taking over-the-counter pain medicines, like aspirin and ibuprofen.  Having a family history of diverticulosis.  What are the signs or symptoms? In most people, there are no symptoms of this condition. If you do have symptoms, they may include:  Bloating.  Cramps in the abdomen.  Constipation or diarrhea.  Pain in the lower left side of the  abdomen.  How is this diagnosed? This condition is most often diagnosed during an exam for other colon problems. Because diverticulosis usually has no symptoms, it often cannot be diagnosed independently. This condition may be diagnosed by:  Using a flexible scope to examine the colon (colonoscopy).  Taking an X-ray of the colon after dye has been put into the colon (barium enema).  Doing a CT scan.  How is this treated? You may not need treatment for this condition if  you have never developed an infection related to diverticulosis. If you have had an infection before, treatment may include:  Eating a high-fiber diet. This may include eating more fruits, vegetables, and grains.  Taking a fiber supplement.  Taking a live bacteria supplement (probiotic).  Taking medicine to relax your colon.  Taking antibiotic medicines.  Follow these instructions at home:  Drink 6-8 glasses of water or more each day to prevent constipation.  Try not to strain when you have a bowel movement.  If you have had an infection before: ? Eat more fiber as directed by your health care provider or your diet and nutrition specialist (dietitian). ? Take a fiber supplement or probiotic, if your health care provider approves.  Take over-the-counter and prescription medicines only as told by your health care provider.  If you were prescribed an antibiotic, take it as told by your health care provider. Do not stop taking the antibiotic even if you start to feel better.  Keep all follow-up visits as told by your health care provider. This is important. Contact a health care provider if:  You have pain in your abdomen.  You have bloating.  You have cramps.  You have not had a bowel movement in 3 days. Get help right away if:  Your pain gets worse.  Your bloating becomes very bad.  You have a fever or chills, and your symptoms suddenly get worse.  You vomit.  You have bowel movements that are bloody or black.  You have bleeding from your rectum. Summary  Diverticulosis is a condition that develops when small pouches (diverticula) form in the wall of the large intestine (colon).  You may have a few pouches or many of them.  This condition is most often diagnosed during an exam for other colon problems.  If you have had an infection related to diverticulosis, treatment may include increasing the fiber in your diet, taking supplements, or taking medicines. This  information is not intended to replace advice given to you by your health care provider. Make sure you discuss any questions you have with your health care provider. Document Released: 02/07/2004 Document Revised: 03/31/2016 Document Reviewed: 03/31/2016 Elsevier Interactive Patient Education  2017 [place] usual medications as before. High fiber diet. No driving for 24 hours. Next screening exam in 10 years.

## 2017-09-09 NOTE — Op Note (Signed)
[facility] Patient Name: Kelly Scott Procedure Date: 09/09/2017 8:14 AM MRN: [id number] Date of Birth: 16-May-1956 Attending MD: Hildred Laser , MD CSN: [id number] Age: 62 Admit Type: Outpatient Procedure:                Colonoscopy Indications:              Screening for colorectal malignant neoplasm Providers:                Hildred Laser, MD, Charlsie Quest. Theda Sers RN, RN, Nelma Rothman, Technician Referring MD:             Delphina Cahill, MD Medicines:                Meperidine 50 mg IV, Midazolam 9 mg IV Complications:            No immediate complications. Estimated Blood Loss:     Estimated blood loss: none. Procedure:                Pre-Anesthesia Assessment:                           - Prior to the procedure, a History and Physical                            was performed, and patient medications and                            allergies were reviewed. The patient's tolerance of                            previous anesthesia was also reviewed. The risks                            and benefits of the procedure and the sedation                            options and risks were discussed with the patient.                            All questions were answered, and informed consent                            was obtained. Prior Anticoagulants: The patient                            last took ibuprofen 1 day prior to the procedure.                            ASA Grade Assessment: II - A patient with mild                            systemic disease. After reviewing the risks and  benefits, the patient was deemed in satisfactory                            condition to undergo the procedure.                           After obtaining informed consent, the colonoscope                            was passed under direct vision. Throughout the                            procedure, the patient's blood pressure, pulse, and        oxygen saturations were monitored continuously. The                            EC-[id number] ([id number]) scope was introduced through                            the anus and advanced to the the cecum, identified                            by appendiceal orifice and ileocecal valve. The                            colonoscopy was performed without difficulty. The                            patient tolerated the procedure well. The quality                            of the bowel preparation was excellent. The                            ileocecal valve, appendiceal orifice, and rectum                            were photographed. Scope In: 8:28:44 AM Scope Out: 8:41:44 AM Scope Withdrawal Time: 0 hours 7 minutes 6 seconds  Total Procedure Duration: 0 hours 13 minutes 0 seconds  Findings:      The perianal and digital rectal examinations were normal.      A few medium-mouthed diverticula were found in the sigmoid colon.      The exam was otherwise normal throughout the examined colon.      External hemorrhoids were found during retroflexion. The hemorrhoids       were small. Impression:               - Diverticulosis in the sigmoid colon.                           - External hemorrhoids.                           - No specimens collected. Moderate Sedation:      Moderate (conscious) sedation  was administered by the endoscopy nurse       and supervised by the endoscopist. The following parameters were       monitored: oxygen saturation, heart rate, blood pressure, CO2       capnography and response to care. Total physician intraservice time was       21 minutes. Recommendation:           - Patient has a contact number available for                            emergencies. The signs and symptoms of potential                            delayed complications were discussed with the                            patient. Return to normal activities tomorrow.                            Written  discharge instructions were provided to the                            patient.                           - High fiber diet today.                           - Continue present medications.                           - Repeat colonoscopy in 10 years for screening                            purposes. Procedure Code(s):        --- Professional ---                           [phone], Colonoscopy, flexible; diagnostic, including                            collection of specimen(s) by brushing or washing,                            when performed (separate procedure)                           G0500, Moderate sedation services provided by the                            same physician or other qualified health care                            professional performing a gastrointestinal                            endoscopic service that  sedation supports,                            requiring the presence of an independent trained                            observer to assist in the monitoring of the                            patient's level of consciousness and physiological                            status; initial 15 minutes of intra-service time;                            patient age 45 years or older (additional time may                            be reported with [phone], as appropriate) Diagnosis Code(s):        --- Professional ---                           Z12.11, Encounter for screening for malignant                            neoplasm of colon                           K64.4, Residual hemorrhoidal skin tags                           K57.30, Diverticulosis of large intestine without                            perforation or abscess without bleeding CPT copyright 2017 American Medical Association. All rights reserved. The codes documented in this report are preliminary and upon coder review may  be revised to meet current compliance requirements. Hildred Laser, MD Hildred Laser, MD 09/09/2017  8:47:17 AM This report has been signed electronically. Number of Addenda: 0

## 2017-09-09 NOTE — H&P (Signed)
Kelly Scott is an 62 y.o. female.   Chief Complaint: Patient is in for colonoscopy. HPI: Patient is 62 year old Caucasian female who is here for screening colonoscopy.  Last exam was normal 12 years ago.  She denies abdominal pain change in bowel habits or rectal bleeding. Family history is negative for CRC.  Past Medical History:  Diagnosis Date  . Anxiety   . Depression   . High cholesterol   . Insomnia   . Seasonal allergies        H/o Sciatica.  Past Surgical History:  Procedure Laterality Date  . ABDOMINAL HYSTERECTOMY    . CESAREAN SECTION    . COLONOSCOPY    . COLONOSCOPY    . REFRACTIVE SURGERY    . Right toe surgery      Family History  Problem Relation Age of Onset  . Dementia Mother   . Emphysema Father   . Colon cancer Neg Hx    Social History:  reports that she has never smoked. She has never used smokeless tobacco. She reports that she drinks alcohol. She reports that she does not use drugs.  Allergies:  Allergies  Allergen Reactions  . Amoxicillin-Pot Clavulanate Nausea And Vomiting  . Codeine Nausea And Vomiting    Medications Prior to Admission  Medication Sig Dispense Refill  . ALPRAZolam (XANAX) 0.25 MG tablet Take 0.25 mg by mouth 3 (three) times daily as needed for anxiety.    . Estradiol (DIVIGEL) 0.25 EH/2.12YQ GEL Place 1 application onto the skin at bedtime.    . Fenofibrate Micronized (ANTARA) 90 MG CAPS Take 90 mg by mouth daily after supper.    . fexofenadine (ALLEGRA) 180 MG tablet Take 180 mg by mouth daily.    . fluticasone (FLONASE) 50 MCG/ACT nasal spray Place 1 spray into both nostrils daily as needed for allergies or rhinitis.    Marland Kitchen ibuprofen (ADVIL,MOTRIN) 200 MG tablet Take 400 mg by mouth 2 (two) times daily.    . pentosan polysulfate (ELMIRON) 100 MG capsule Take 100 mg by mouth 3 (three) times daily.    . pregabalin (LYRICA) 50 MG capsule Take 50 mg by mouth 2 (two) times daily.    . simvastatin (ZOCOR) 10 MG tablet Take 10  mg by mouth daily after supper.     . traZODone (DESYREL) 50 MG tablet Take 50 mg by mouth at bedtime.       No results found for this or any previous visit (from the past 48 hour(s)). No results found.  ROS  Blood pressure 138/72, pulse 69, temperature 97.6 F (36.4 C), temperature source Oral, resp. rate 12, height 5' 2.25" (1.581 m), weight 155 lb (70.3 kg), SpO2 100 %. Physical Exam  Constitutional: She appears well-developed and well-nourished.  HENT:  Mouth/Throat: Oropharynx is clear and moist.  Eyes: Conjunctivae are normal. No scleral icterus.  Neck: No thyromegaly present.  Cardiovascular: Normal rate, regular rhythm and normal heart sounds.  No murmur heard. Respiratory: Effort normal and breath sounds normal.  GI: Soft. She exhibits no distension and no mass. There is no tenderness.  Musculoskeletal: She exhibits no edema.  Lymphadenopathy:    She has no cervical adenopathy.  Neurological: She is alert.  Skin: Skin is warm and dry.     Assessment/Plan Average risk screening colonoscopy.  Hildred Laser, MD 09/09/2017, 8:18 AM

## 2017-09-15 ENCOUNTER — Encounter (HOSPITAL_COMMUNITY): Payer: Self-pay | Admitting: Internal Medicine

## 2017-09-16 DIAGNOSIS — E782 Mixed hyperlipidemia: Secondary | ICD-10-CM | POA: Diagnosis not present

## 2017-09-16 DIAGNOSIS — R7301 Impaired fasting glucose: Secondary | ICD-10-CM | POA: Diagnosis not present

## 2017-09-16 DIAGNOSIS — M79605 Pain in left leg: Secondary | ICD-10-CM | POA: Diagnosis not present

## 2017-09-18 DIAGNOSIS — G4700 Insomnia, unspecified: Secondary | ICD-10-CM | POA: Diagnosis not present

## 2017-09-18 DIAGNOSIS — E782 Mixed hyperlipidemia: Secondary | ICD-10-CM | POA: Diagnosis not present

## 2017-09-18 DIAGNOSIS — R945 Abnormal results of liver function studies: Secondary | ICD-10-CM | POA: Diagnosis not present

## 2017-11-05 DIAGNOSIS — Z01419 Encounter for gynecological examination (general) (routine) without abnormal findings: Secondary | ICD-10-CM | POA: Diagnosis not present

## 2017-11-05 DIAGNOSIS — Z1231 Encounter for screening mammogram for malignant neoplasm of breast: Secondary | ICD-10-CM | POA: Diagnosis not present

## 2017-11-23 DIAGNOSIS — B079 Viral wart, unspecified: Secondary | ICD-10-CM | POA: Diagnosis not present

## 2017-11-23 DIAGNOSIS — B078 Other viral warts: Secondary | ICD-10-CM | POA: Diagnosis not present

## 2017-11-23 DIAGNOSIS — D485 Neoplasm of uncertain behavior of skin: Secondary | ICD-10-CM | POA: Diagnosis not present

## 2017-11-23 DIAGNOSIS — C44729 Squamous cell carcinoma of skin of left lower limb, including hip: Secondary | ICD-10-CM | POA: Diagnosis not present

## 2017-12-16 DIAGNOSIS — C44729 Squamous cell carcinoma of skin of left lower limb, including hip: Secondary | ICD-10-CM | POA: Diagnosis not present

## 2017-12-25 DIAGNOSIS — N3010 Interstitial cystitis (chronic) without hematuria: Secondary | ICD-10-CM | POA: Diagnosis not present

## 2018-01-27 DIAGNOSIS — H524 Presbyopia: Secondary | ICD-10-CM | POA: Diagnosis not present

## 2018-02-05 DIAGNOSIS — Z79899 Other long term (current) drug therapy: Secondary | ICD-10-CM | POA: Diagnosis not present

## 2018-02-05 DIAGNOSIS — R58 Hemorrhage, not elsewhere classified: Secondary | ICD-10-CM | POA: Diagnosis not present

## 2018-02-05 DIAGNOSIS — K920 Hematemesis: Secondary | ICD-10-CM | POA: Diagnosis not present

## 2018-02-05 DIAGNOSIS — R040 Epistaxis: Secondary | ICD-10-CM | POA: Diagnosis not present

## 2018-02-05 DIAGNOSIS — I10 Essential (primary) hypertension: Secondary | ICD-10-CM | POA: Diagnosis not present

## 2018-02-23 DIAGNOSIS — Z23 Encounter for immunization: Secondary | ICD-10-CM | POA: Diagnosis not present

## 2018-03-02 DIAGNOSIS — Z7289 Other problems related to lifestyle: Secondary | ICD-10-CM | POA: Diagnosis not present

## 2018-03-02 DIAGNOSIS — R040 Epistaxis: Secondary | ICD-10-CM | POA: Diagnosis not present

## 2018-03-02 DIAGNOSIS — H938X3 Other specified disorders of ear, bilateral: Secondary | ICD-10-CM | POA: Diagnosis not present

## 2018-03-14 DIAGNOSIS — J018 Other acute sinusitis: Secondary | ICD-10-CM | POA: Diagnosis not present

## 2018-03-14 DIAGNOSIS — R05 Cough: Secondary | ICD-10-CM | POA: Diagnosis not present

## 2018-04-12 DIAGNOSIS — M545 Low back pain: Secondary | ICD-10-CM | POA: Diagnosis not present

## 2018-04-12 DIAGNOSIS — E782 Mixed hyperlipidemia: Secondary | ICD-10-CM | POA: Diagnosis not present

## 2018-04-12 DIAGNOSIS — M79605 Pain in left leg: Secondary | ICD-10-CM | POA: Diagnosis not present

## 2018-04-12 DIAGNOSIS — M79604 Pain in right leg: Secondary | ICD-10-CM | POA: Diagnosis not present

## 2018-04-26 DIAGNOSIS — Z0000 Encounter for general adult medical examination without abnormal findings: Secondary | ICD-10-CM | POA: Diagnosis not present

## 2018-04-26 DIAGNOSIS — E782 Mixed hyperlipidemia: Secondary | ICD-10-CM | POA: Diagnosis not present

## 2018-04-26 DIAGNOSIS — R945 Abnormal results of liver function studies: Secondary | ICD-10-CM | POA: Diagnosis not present

## 2018-05-20 DIAGNOSIS — J019 Acute sinusitis, unspecified: Secondary | ICD-10-CM | POA: Diagnosis not present

## 2018-05-20 DIAGNOSIS — J029 Acute pharyngitis, unspecified: Secondary | ICD-10-CM | POA: Diagnosis not present

## 2018-06-01 DIAGNOSIS — R05 Cough: Secondary | ICD-10-CM | POA: Diagnosis not present

## 2018-06-01 DIAGNOSIS — Z5181 Encounter for therapeutic drug level monitoring: Secondary | ICD-10-CM | POA: Diagnosis not present

## 2018-06-01 DIAGNOSIS — E663 Overweight: Secondary | ICD-10-CM | POA: Diagnosis not present

## 2018-06-01 DIAGNOSIS — R030 Elevated blood-pressure reading, without diagnosis of hypertension: Secondary | ICD-10-CM | POA: Diagnosis not present

## 2018-06-08 DIAGNOSIS — D225 Melanocytic nevi of trunk: Secondary | ICD-10-CM | POA: Diagnosis not present

## 2018-06-08 DIAGNOSIS — D485 Neoplasm of uncertain behavior of skin: Secondary | ICD-10-CM | POA: Diagnosis not present

## 2018-06-08 DIAGNOSIS — L739 Follicular disorder, unspecified: Secondary | ICD-10-CM | POA: Diagnosis not present

## 2018-06-08 DIAGNOSIS — Z23 Encounter for immunization: Secondary | ICD-10-CM | POA: Diagnosis not present

## 2018-06-08 DIAGNOSIS — B078 Other viral warts: Secondary | ICD-10-CM | POA: Diagnosis not present

## 2018-06-08 DIAGNOSIS — L821 Other seborrheic keratosis: Secondary | ICD-10-CM | POA: Diagnosis not present

## 2018-06-08 DIAGNOSIS — Z86018 Personal history of other benign neoplasm: Secondary | ICD-10-CM | POA: Diagnosis not present

## 2018-07-28 DIAGNOSIS — M5431 Sciatica, right side: Secondary | ICD-10-CM | POA: Diagnosis not present

## 2018-07-28 DIAGNOSIS — Z6825 Body mass index (BMI) 25.0-25.9, adult: Secondary | ICD-10-CM | POA: Diagnosis not present

## 2018-11-09 ENCOUNTER — Other Ambulatory Visit: Payer: Self-pay | Admitting: Obstetrics & Gynecology

## 2018-11-09 DIAGNOSIS — Z1231 Encounter for screening mammogram for malignant neoplasm of breast: Secondary | ICD-10-CM

## 2018-11-10 DIAGNOSIS — N182 Chronic kidney disease, stage 2 (mild): Secondary | ICD-10-CM | POA: Diagnosis not present

## 2018-11-10 DIAGNOSIS — R945 Abnormal results of liver function studies: Secondary | ICD-10-CM | POA: Diagnosis not present

## 2018-11-10 DIAGNOSIS — E782 Mixed hyperlipidemia: Secondary | ICD-10-CM | POA: Diagnosis not present

## 2018-11-16 DIAGNOSIS — G4700 Insomnia, unspecified: Secondary | ICD-10-CM | POA: Diagnosis not present

## 2018-11-16 DIAGNOSIS — E782 Mixed hyperlipidemia: Secondary | ICD-10-CM | POA: Diagnosis not present

## 2018-11-16 DIAGNOSIS — N183 Chronic kidney disease, stage 3 (moderate): Secondary | ICD-10-CM | POA: Diagnosis not present

## 2018-11-16 DIAGNOSIS — R945 Abnormal results of liver function studies: Secondary | ICD-10-CM | POA: Diagnosis not present

## 2018-12-02 ENCOUNTER — Other Ambulatory Visit: Payer: Self-pay

## 2018-12-03 ENCOUNTER — Ambulatory Visit: Payer: BC Managed Care – PPO | Admitting: Obstetrics & Gynecology

## 2018-12-03 ENCOUNTER — Encounter: Payer: Self-pay | Admitting: Obstetrics & Gynecology

## 2018-12-03 ENCOUNTER — Telehealth: Payer: Self-pay | Admitting: *Deleted

## 2018-12-03 VITALS — BP 130/70 | Ht 62.0 in | Wt 131.0 lb

## 2018-12-03 DIAGNOSIS — Z90710 Acquired absence of both cervix and uterus: Secondary | ICD-10-CM | POA: Diagnosis not present

## 2018-12-03 DIAGNOSIS — Z01419 Encounter for gynecological examination (general) (routine) without abnormal findings: Secondary | ICD-10-CM

## 2018-12-03 DIAGNOSIS — Z90722 Acquired absence of ovaries, bilateral: Secondary | ICD-10-CM | POA: Diagnosis not present

## 2018-12-03 DIAGNOSIS — Z9079 Acquired absence of other genital organ(s): Secondary | ICD-10-CM

## 2018-12-03 DIAGNOSIS — Z780 Asymptomatic menopausal state: Secondary | ICD-10-CM | POA: Diagnosis not present

## 2018-12-03 NOTE — Telephone Encounter (Signed)
Patient informed with below note. 

## 2018-12-03 NOTE — Progress Notes (Signed)
Kelly Scott 10/17/1955 [id number]   History:    63 y.o. G32P2A2L1 Married  RP:  Established patient presenting for annual gyn exam   HPI: S/P TAH/BSO at age 47 yo for Fibroids/Endometriosis.  Menopause, no HRT x 06/2017, was on Divigel.  No pelvic pain.  No pain with IC.  Urine/BMs normal.  Breasts normal.  BMI 23.96.  Lost 32 Lbs with lower Calorie/Carb diet on Phentermine and regular fitness activities.  Health labs with Fam MD.  Harriet Masson 2018.  Past medical history,surgical history, family history and social history were all reviewed and documented in the EPIC chart.  Gynecologic History No LMP recorded. Patient has had a hysterectomy. Contraception: status post hysterectomy Last Pap: 06/2016. Results were: Negative/HPV HR Negative Last mammogram: 10/2017. Results were: Negative Bone Density: 03/2017 Normal.  Repeat at 5 yrs. Colonoscopy: 2018  Obstetric History OB History  Gravida Para Term Preterm AB Living  4 2     2 1   SAB TAB Ectopic Multiple Live Births  2            # Outcome Date GA Lbr Len/2nd Weight Sex Delivery Anes PTL Lv  4 SAB           3 SAB           2 Para           1 Para             Obstetric Comments  1 son passed away at 27.     ROS: A ROS was performed and pertinent positives and negatives are included in the history.  GENERAL: No fevers or chills. HEENT: No change in vision, no earache, sore throat or sinus congestion. NECK: No pain or stiffness. CARDIOVASCULAR: No chest pain or pressure. No palpitations. PULMONARY: No shortness of breath, cough or wheeze. GASTROINTESTINAL: No abdominal pain, nausea, vomiting or diarrhea, melena or bright red blood per rectum. GENITOURINARY: No urinary frequency, urgency, hesitancy or dysuria. MUSCULOSKELETAL: No joint or muscle pain, no back pain, no recent trauma. DERMATOLOGIC: No rash, no itching, no lesions. ENDOCRINE: No polyuria, polydipsia, no heat or cold intolerance. No recent change in weight.  HEMATOLOGICAL: No anemia or easy bruising or bleeding. NEUROLOGIC: No headache, seizures, numbness, tingling or weakness. PSYCHIATRIC: No depression, no loss of interest in normal activity or change in sleep pattern.     Exam:   BP 130/70   Ht 5\' 2"  (1.575 m)   Wt 131 lb (59.4 kg)   BMI 23.96 kg/m   Body mass index is 23.96 kg/m.  General appearance : Well developed well nourished female. No acute distress HEENT: Eyes: no retinal hemorrhage or exudates,  Neck supple, trachea midline, no carotid bruits, no thyroidmegaly Lungs: Clear to auscultation, no rhonchi or wheezes, or rib retractions  Heart: Regular rate and rhythm, no murmurs or gallops Breast:Examined in sitting and supine position were symmetrical in appearance, no palpable masses or tenderness,  no skin retraction, no nipple inversion, no nipple discharge, no skin discoloration, no axillary or supraclavicular lymphadenopathy Abdomen: no palpable masses or tenderness, no rebound or guarding Extremities: no edema or skin discoloration or tenderness  Pelvic: Vulva: Normal             Vagina: No gross lesions or discharge  Cervix/Uterus absent  Adnexa  Without masses or tenderness  Anus: Normal   Assessment/Plan:  63 y.o. female for annual exam   1. Well female exam with routine gynecological exam Gynecologic exam  status post TAH/BSO.  Last Pap test February 2018 was negative with negative high-risk HPV.  Breast exam normal.  Last screening mammogram June 2019 was negative, patient will schedule a repeat screening mammogram now.  Colonoscopy in 2018.  Health labs with family physician.  Good body mass index at 23.96.  Continue with fitness and healthy nutrition.  2. S/P TAH-BSO  3. Postmenopause Well on no hormone replacement therapy.  Bone density was normal in November 2018.  Will repeat at 5 years.  Continue with vitamin D supplements, calcium intake of 1200 mg daily and regular weightbearing physical activities.   Princess Bruins MD, 11:42 AM 12/03/2018

## 2018-12-03 NOTE — Telephone Encounter (Signed)
Patient was seen today and forgot to discuss if she was to continue Divigel 0.25 mg gel? If yes she would like a Rx for this. Please advise

## 2018-12-03 NOTE — Telephone Encounter (Signed)
She has been on it x many yrs, >10 yrs.  I recommend stopping Estrogen replacement therapy.  Risks of Breast Cancer and stroke.  Can do a televisit if she feels she cannot stop.

## 2018-12-11 ENCOUNTER — Encounter: Payer: Self-pay | Admitting: Obstetrics & Gynecology

## 2018-12-11 NOTE — Patient Instructions (Signed)
1. Well female exam with routine gynecological exam Gynecologic exam status post TAH/BSO.  Last Pap test February 2018 was negative with negative high-risk HPV.  Breast exam normal.  Last screening mammogram June 2019 was negative, patient will schedule a repeat screening mammogram now.  Colonoscopy in 2018.  Health labs with family physician.  Good body mass index at 23.96.  Continue with fitness and healthy nutrition.  2. S/P TAH-BSO  3. Postmenopause Well on no hormone replacement therapy.  Bone density was normal in November 2018.  Will repeat at 5 years.  Continue with vitamin D supplements, calcium intake of 1200 mg daily and regular weightbearing physical activities.  Kelly Scott, it was a pleasure seeing you today!

## 2019-01-20 ENCOUNTER — Other Ambulatory Visit: Payer: Self-pay

## 2019-01-20 ENCOUNTER — Ambulatory Visit
Admission: RE | Admit: 2019-01-20 | Discharge: 2019-01-20 | Disposition: A | Discharge status: Home / Self Care | Payer: BC Managed Care – PPO | Source: Ambulatory Visit | Attending: Obstetrics & Gynecology | Admitting: Obstetrics & Gynecology

## 2019-01-20 DIAGNOSIS — Z1231 Encounter for screening mammogram for malignant neoplasm of breast: Secondary | ICD-10-CM

## 2019-02-16 DIAGNOSIS — Z713 Dietary counseling and surveillance: Secondary | ICD-10-CM | POA: Diagnosis not present

## 2019-02-16 DIAGNOSIS — Z6824 Body mass index (BMI) 24.0-24.9, adult: Secondary | ICD-10-CM | POA: Diagnosis not present

## 2019-03-01 DIAGNOSIS — H524 Presbyopia: Secondary | ICD-10-CM | POA: Diagnosis not present

## 2019-04-15 DIAGNOSIS — Z23 Encounter for immunization: Secondary | ICD-10-CM | POA: Diagnosis not present

## 2019-04-16 DIAGNOSIS — N390 Urinary tract infection, site not specified: Secondary | ICD-10-CM | POA: Diagnosis not present

## 2019-06-22 DIAGNOSIS — L821 Other seborrheic keratosis: Secondary | ICD-10-CM | POA: Diagnosis not present

## 2019-06-22 DIAGNOSIS — L578 Other skin changes due to chronic exposure to nonionizing radiation: Secondary | ICD-10-CM | POA: Diagnosis not present

## 2019-06-22 DIAGNOSIS — D225 Melanocytic nevi of trunk: Secondary | ICD-10-CM | POA: Diagnosis not present

## 2019-06-22 DIAGNOSIS — L820 Inflamed seborrheic keratosis: Secondary | ICD-10-CM | POA: Diagnosis not present

## 2019-07-05 ENCOUNTER — Other Ambulatory Visit: Payer: Self-pay | Admitting: Orthopedic Surgery

## 2019-07-05 DIAGNOSIS — M25531 Pain in right wrist: Secondary | ICD-10-CM

## 2019-07-05 DIAGNOSIS — S63592A Other specified sprain of left wrist, initial encounter: Secondary | ICD-10-CM

## 2019-07-25 ENCOUNTER — Other Ambulatory Visit: Payer: Self-pay

## 2019-07-25 ENCOUNTER — Ambulatory Visit
Admission: RE | Admit: 2019-07-25 | Discharge: 2019-07-25 | Disposition: A | Discharge status: Home / Self Care | Payer: BC Managed Care – PPO | Source: Ambulatory Visit | Attending: Orthopedic Surgery | Admitting: Orthopedic Surgery

## 2019-07-25 DIAGNOSIS — M25531 Pain in right wrist: Secondary | ICD-10-CM

## 2019-07-25 DIAGNOSIS — S63591A Other specified sprain of right wrist, initial encounter: Secondary | ICD-10-CM | POA: Diagnosis not present

## 2019-07-25 DIAGNOSIS — S63592A Other specified sprain of left wrist, initial encounter: Secondary | ICD-10-CM

## 2019-07-25 MED ORDER — IOPAMIDOL (ISOVUE-M 200) INJECTION 41%
2.0000 mL | Freq: Once | INTRAMUSCULAR | Status: AC
  Administered 2019-07-25: 16:00:00 2 mL via INTRA_ARTICULAR

## 2019-08-08 DIAGNOSIS — M24131 Other articular cartilage disorders, right wrist: Secondary | ICD-10-CM | POA: Diagnosis not present

## 2019-08-18 DIAGNOSIS — Z23 Encounter for immunization: Secondary | ICD-10-CM | POA: Diagnosis not present

## 2019-09-10 DIAGNOSIS — Z23 Encounter for immunization: Secondary | ICD-10-CM | POA: Diagnosis not present

## 2019-09-22 DIAGNOSIS — M79674 Pain in right toe(s): Secondary | ICD-10-CM | POA: Diagnosis not present

## 2019-09-22 DIAGNOSIS — M2041 Other hammer toe(s) (acquired), right foot: Secondary | ICD-10-CM | POA: Diagnosis not present

## 2019-12-06 ENCOUNTER — Encounter: Payer: BC Managed Care – PPO | Admitting: Obstetrics & Gynecology

## 2019-12-12 ENCOUNTER — Other Ambulatory Visit: Payer: Self-pay | Admitting: Obstetrics & Gynecology

## 2019-12-12 DIAGNOSIS — Z1231 Encounter for screening mammogram for malignant neoplasm of breast: Secondary | ICD-10-CM

## 2019-12-12 DIAGNOSIS — N3010 Interstitial cystitis (chronic) without hematuria: Secondary | ICD-10-CM | POA: Diagnosis not present

## 2019-12-15 ENCOUNTER — Encounter: Payer: BC Managed Care – PPO | Admitting: Obstetrics & Gynecology

## 2020-01-23 ENCOUNTER — Other Ambulatory Visit: Payer: Self-pay

## 2020-01-23 ENCOUNTER — Ambulatory Visit
Admission: RE | Admit: 2020-01-23 | Discharge: 2020-01-23 | Disposition: A | Discharge status: Home / Self Care | Payer: BC Managed Care – PPO | Source: Ambulatory Visit | Attending: Obstetrics & Gynecology | Admitting: Obstetrics & Gynecology

## 2020-01-23 DIAGNOSIS — Z1231 Encounter for screening mammogram for malignant neoplasm of breast: Secondary | ICD-10-CM | POA: Diagnosis not present

## 2020-02-14 DIAGNOSIS — M5441 Lumbago with sciatica, right side: Secondary | ICD-10-CM | POA: Diagnosis not present

## 2020-02-14 DIAGNOSIS — S338XXA Sprain of other parts of lumbar spine and pelvis, initial encounter: Secondary | ICD-10-CM | POA: Diagnosis not present

## 2020-02-16 DIAGNOSIS — S338XXA Sprain of other parts of lumbar spine and pelvis, initial encounter: Secondary | ICD-10-CM | POA: Diagnosis not present

## 2020-02-16 DIAGNOSIS — M5441 Lumbago with sciatica, right side: Secondary | ICD-10-CM | POA: Diagnosis not present

## 2020-02-17 DIAGNOSIS — S338XXA Sprain of other parts of lumbar spine and pelvis, initial encounter: Secondary | ICD-10-CM | POA: Diagnosis not present

## 2020-02-17 DIAGNOSIS — M5441 Lumbago with sciatica, right side: Secondary | ICD-10-CM | POA: Diagnosis not present

## 2020-02-20 DIAGNOSIS — M5441 Lumbago with sciatica, right side: Secondary | ICD-10-CM | POA: Diagnosis not present

## 2020-02-20 DIAGNOSIS — S338XXA Sprain of other parts of lumbar spine and pelvis, initial encounter: Secondary | ICD-10-CM | POA: Diagnosis not present

## 2020-02-23 ENCOUNTER — Encounter: Payer: BC Managed Care – PPO | Admitting: Obstetrics & Gynecology

## 2020-03-01 DIAGNOSIS — H524 Presbyopia: Secondary | ICD-10-CM | POA: Diagnosis not present

## 2020-03-06 ENCOUNTER — Ambulatory Visit (INDEPENDENT_AMBULATORY_CARE_PROVIDER_SITE_OTHER): Payer: BC Managed Care – PPO | Admitting: Obstetrics & Gynecology

## 2020-03-06 ENCOUNTER — Encounter: Payer: Self-pay | Admitting: Obstetrics & Gynecology

## 2020-03-06 ENCOUNTER — Other Ambulatory Visit: Payer: Self-pay

## 2020-03-06 VITALS — BP 140/92 | Ht 61.5 in | Wt 142.6 lb

## 2020-03-06 DIAGNOSIS — Z90722 Acquired absence of ovaries, bilateral: Secondary | ICD-10-CM | POA: Diagnosis not present

## 2020-03-06 DIAGNOSIS — Z780 Asymptomatic menopausal state: Secondary | ICD-10-CM

## 2020-03-06 DIAGNOSIS — Z01419 Encounter for gynecological examination (general) (routine) without abnormal findings: Secondary | ICD-10-CM

## 2020-03-06 DIAGNOSIS — Z90710 Acquired absence of both cervix and uterus: Secondary | ICD-10-CM

## 2020-03-06 DIAGNOSIS — Z9079 Acquired absence of other genital organ(s): Secondary | ICD-10-CM

## 2020-03-06 NOTE — Progress Notes (Signed)
Kelly Scott 1956/04/18 [id number]   History:    64 y.o. G77P2A2L1 Married  RP:  Established patient presenting for annual gyn exam   HPI: S/P TAH/BSO at age 26 yo for Fibroids/Endometriosis.  Menopause, no HRT x 06/2017.  No pelvic pain.  No pain with IC.  Urine/BMs normal.  Breasts normal.  BMI 26.51.  Stopped Phentermine.  Walking regularly, active life style.  Health labs with Fam MD.  BD normal 2018.  Colono 2018.  Past medical history,surgical history, family history and social history were all reviewed and documented in the EPIC chart.  Gynecologic History No LMP recorded. Patient has had a hysterectomy.  Obstetric History OB History  Gravida Para Term Preterm AB Living  4 2     2 1   SAB TAB Ectopic Multiple Live Births  2            # Outcome Date GA Lbr Len/2nd Weight Sex Delivery Anes PTL Lv  4 SAB           3 SAB           2 Para           1 Para             Obstetric Comments  1 son passed away at 38.     ROS: A ROS was performed and pertinent positives and negatives are included in the history.  GENERAL: No fevers or chills. HEENT: No change in vision, no earache, sore throat or sinus congestion. NECK: No pain or stiffness. CARDIOVASCULAR: No chest pain or pressure. No palpitations. PULMONARY: No shortness of breath, cough or wheeze. GASTROINTESTINAL: No abdominal pain, nausea, vomiting or diarrhea, melena or bright red blood per rectum. GENITOURINARY: No urinary frequency, urgency, hesitancy or dysuria. MUSCULOSKELETAL: No joint or muscle pain, no back pain, no recent trauma. DERMATOLOGIC: No rash, no itching, no lesions. ENDOCRINE: No polyuria, polydipsia, no heat or cold intolerance. No recent change in weight. HEMATOLOGICAL: No anemia or easy bruising or bleeding. NEUROLOGIC: No headache, seizures, numbness, tingling or weakness. PSYCHIATRIC: No depression, no loss of interest in normal activity or change in sleep pattern.     Exam:   BP (!) 140/92    Ht 5' 1.5" (1.562 m)   Wt 142 lb 9.6 oz (64.7 kg)   BMI 26.51 kg/m   Body mass index is 26.51 kg/m.  General appearance : Well developed well nourished female. No acute distress HEENT: Eyes: no retinal hemorrhage or exudates,  Neck supple, trachea midline, no carotid bruits, no thyroidmegaly Lungs: Clear to auscultation, no rhonchi or wheezes, or rib retractions  Heart: Regular rate and rhythm, no murmurs or gallops Breast:Examined in sitting and supine position were symmetrical in appearance, no palpable masses or tenderness,  no skin retraction, no nipple inversion, no nipple discharge, no skin discoloration, no axillary or supraclavicular lymphadenopathy Abdomen: no palpable masses or tenderness, no rebound or guarding Extremities: no edema or skin discoloration or tenderness  Pelvic: Vulva: Normal             Vagina: No gross lesions or discharge  Cervix/Uterus absent  Adnexa  Without masses or tenderness  Anus: Normal   Assessment/Plan:  63 y.o. female for annual exam   1. Well female exam with routine gynecological exam Gynecologic exam status post TAH/BSO.  No indication to repeat a Pap test this year.  Breast exam normal.  Screening mammogram August 2021 was negative.  Colonoscopy 2018.  Health labs  with family physician.  Body mass index 26.51.  Continue with fitness and healthy nutrition.  2. S/P TAH-BSO  3. Postmenopause Well on no hormone replacement therapy.  Bone density in 2018 was normal.  Princess Bruins MD, 12:27 PM 03/06/2020

## 2020-03-07 ENCOUNTER — Encounter: Payer: Self-pay | Admitting: Obstetrics & Gynecology

## 2021-02-26 ENCOUNTER — Other Ambulatory Visit: Payer: Self-pay | Admitting: Adult Health Nurse Practitioner

## 2021-02-26 DIAGNOSIS — Z1231 Encounter for screening mammogram for malignant neoplasm of breast: Secondary | ICD-10-CM

## 2021-03-07 ENCOUNTER — Ambulatory Visit: Payer: Self-pay | Admitting: Obstetrics & Gynecology

## 2021-03-28 ENCOUNTER — Other Ambulatory Visit: Payer: Self-pay

## 2021-03-28 ENCOUNTER — Ambulatory Visit
Admission: RE | Admit: 2021-03-28 | Discharge: 2021-03-28 | Disposition: A | Discharge status: Home / Self Care | Payer: Medicare Other | Source: Ambulatory Visit | Attending: Adult Health Nurse Practitioner | Admitting: Adult Health Nurse Practitioner

## 2021-03-28 DIAGNOSIS — Z1231 Encounter for screening mammogram for malignant neoplasm of breast: Secondary | ICD-10-CM

## 2021-07-18 ENCOUNTER — Ambulatory Visit (INDEPENDENT_AMBULATORY_CARE_PROVIDER_SITE_OTHER): Payer: Medicare Other | Admitting: Plastic Surgery

## 2021-07-18 ENCOUNTER — Encounter: Payer: Self-pay | Admitting: Plastic Surgery

## 2021-07-18 ENCOUNTER — Other Ambulatory Visit: Payer: Self-pay

## 2021-07-18 VITALS — BP 158/80 | HR 80 | Ht 62.0 in | Wt 144.0 lb

## 2021-07-18 DIAGNOSIS — D489 Neoplasm of uncertain behavior, unspecified: Secondary | ICD-10-CM | POA: Diagnosis not present

## 2021-07-18 NOTE — Progress Notes (Signed)
° °  Referring Provider Celene Squibb, MD [address] [place],  [place] [address]   CC:  Left back nevus  Kelly Scott is an 66 y.o. female.  HPI: The patient is a 66 year old with a left buttock nevus.  This was shaved and dermatology by Dr. Jari Pigg.  The lesion had appeared to be a close to the margin.  Patient is being referred for formal excision of the left back lesion.  Patient does have a history of other skin cancers including squamous cell of the leg.  Allergies  Allergen Reactions   Amoxicillin-Pot Clavulanate Nausea And Vomiting   Codeine Nausea And Vomiting    Outpatient Encounter Medications as of 07/18/2021  Medication Sig   ALPRAZolam (XANAX) 0.25 MG tablet Take 0.25 mg by mouth 3 (three) times daily as needed for anxiety.   fexofenadine (ALLEGRA) 180 MG tablet Take 180 mg by mouth daily.   pentosan polysulfate (ELMIRON) 100 MG capsule Take 100 mg by mouth 3 (three) times daily.   simvastatin (ZOCOR) 10 MG tablet Take 10 mg by mouth daily after supper.    traZODone (DESYREL) 50 MG tablet Take 50 mg by mouth at bedtime.    No facility-administered encounter medications on file as of 07/18/2021.     Past Medical History:  Diagnosis Date   Anxiety    Depression    High cholesterol    Insomnia    Seasonal allergies     Past Surgical History:  Procedure Laterality Date   ABDOMINAL HYSTERECTOMY     BREAST EXCISIONAL BIOPSY Left    12 to 13 Years ago    CESAREAN SECTION     COLONOSCOPY     COLONOSCOPY     COLONOSCOPY N/A 09/09/2017   Procedure: COLONOSCOPY;  Surgeon: Rogene Houston, MD;  Location: [facility];  Service: Endoscopy;  Laterality: N/A;  830   REFRACTIVE SURGERY     Right toe surgery     WRIST SURGERY Right    TENDON REPAIN     Family History  Problem Relation Age of Onset   Dementia Mother    Hypertension Mother    Emphysema Father    Colon cancer Neg Hx    Breast cancer Neg Hx     Social History   Social History Narrative    Patient is married and lives at home.    Patient has 2 children and one is deceased.    Patient has a high school education.        Review of Systems General: Denies fevers, chills, weight loss CV: Denies chest pain, shortness of breath, palpitations   Physical Exam Vitals with BMI 07/18/2021 03/06/2020 12/03/2018  Height 5\' 2"  5' 1.5" 5\' 2"   Weight 144 lbs 142 lbs 10 oz 131 lbs  BMI 26.33 60.63 01.60  Systolic 109 323 557  Diastolic 80 92 70  Pulse 80 - -    General:  No acute distress,  Alert and oriented, Non-Toxic, Normal speech and affect Skin: 1 cm healing shave biopsy site left paraspinal mid back.   Assessment/Plan Formal excision with small margin indicated given severe atypia on pathology.  We will schedule in office under local.  Lennice Sites 07/18/2021, 12:06 PM

## 2021-08-05 ENCOUNTER — Other Ambulatory Visit (HOSPITAL_COMMUNITY)
Admission: RE | Admit: 2021-08-05 | Discharge: 2021-08-05 | Disposition: A | Discharge status: Home / Self Care | Payer: Medicare Other | Source: Ambulatory Visit | Attending: Plastic Surgery | Admitting: Plastic Surgery

## 2021-08-05 ENCOUNTER — Encounter: Payer: Self-pay | Admitting: Plastic Surgery

## 2021-08-05 ENCOUNTER — Ambulatory Visit (INDEPENDENT_AMBULATORY_CARE_PROVIDER_SITE_OTHER): Payer: Medicare Other | Admitting: Plastic Surgery

## 2021-08-05 VITALS — BP 165/81 | HR 89 | Ht 62.0 in | Wt 146.4 lb

## 2021-08-05 DIAGNOSIS — D485 Neoplasm of uncertain behavior of skin: Secondary | ICD-10-CM | POA: Diagnosis not present

## 2021-08-05 DIAGNOSIS — D489 Neoplasm of uncertain behavior, unspecified: Secondary | ICD-10-CM | POA: Insufficient documentation

## 2021-08-05 NOTE — Progress Notes (Signed)
Operative Note  ? ?DATE OF OPERATION: 08/05/2021 ? ?LOCATION:  left back ? ?SURGICAL DEPARTMENT: Plastic Surgery ? ?PREOPERATIVE DIAGNOSES:  left back nevus severe atypia ? ?POSTOPERATIVE DIAGNOSES:  same ? ?PROCEDURE:  ?Excision of left back measuring 2 cm ?Intermediate closure measuring 2 cm left back ? ?SURGEON: Melene Plan. Marcquis Ridlon, MD ? ?ANESTHESIA:  Local ? ?COMPLICATIONS: None.  ? ?INDICATIONS FOR PROCEDURE:  ?The patient, Kelly Scott is a 66 y.o. female born on 11-Feb-1956, is here for treatment of excision neoplasm left back with severe atypia. ?MRN: [id number] ? ?CONSENT:  ?Informed consent was obtained directly from the patient. Risks, benefits and alternatives were fully discussed. Specific risks including but not limited to bleeding, infection, hematoma, seroma, scarring, pain, infection, wound healing problems, and need for further surgery were all discussed. The patient did have an ample opportunity to have questions answered to satisfaction.  ? ?DESCRIPTION OF PROCEDURE:  ?Local anesthesia was administered. The patient's operative site was prepped and draped in a sterile fashion. A time out was performed and all information was confirmed to be correct.  The lesion was excised with a 15 blade.  Hemostasis was obtained.  Circumferential undermining was performed and the skin was advanced and closed in layers with PDS for deep tissue and Prolene for the skin.  The lesion excised measured 2 cm, and the total length of closure measured 2 cm.   ? ?The patient tolerated the procedure well.  There were no complications. ?  ?

## 2021-08-19 ENCOUNTER — Encounter: Payer: Self-pay | Admitting: Plastic Surgery

## 2021-08-19 ENCOUNTER — Ambulatory Visit (INDEPENDENT_AMBULATORY_CARE_PROVIDER_SITE_OTHER): Payer: Medicare Other | Admitting: Surgical

## 2021-08-19 ENCOUNTER — Other Ambulatory Visit: Payer: Self-pay

## 2021-08-19 DIAGNOSIS — D489 Neoplasm of uncertain behavior, unspecified: Secondary | ICD-10-CM

## 2021-08-19 LAB — SURGICAL PATHOLOGY

## 2021-08-19 NOTE — Progress Notes (Signed)
66 year old female here for follow-up after excision of left back lesion that had severe atypia upon shave excision by dermatology.  Subsequently underwent excision of left back lesion on 08/05/2021 with Dr. Erin Hearing.  Pathology report is still pending. ? ?Patient is doing well.  Prolene sutures are removed, patient tolerated this well.  Incision is intact and healing well.  No erythema or cellulitic changes. ? ?Discussed with patient once pathology results were available we will call patient to discuss.  We discussed that if margins were negative no further treatment will be necessary.  We discussed if they were positive margins reexcision may be necessary.  Recommend calling with questions or concerns.  No signs of infection. ?

## 2022-02-25 ENCOUNTER — Other Ambulatory Visit: Payer: Self-pay | Admitting: Adult Health Nurse Practitioner

## 2022-02-25 DIAGNOSIS — Z1231 Encounter for screening mammogram for malignant neoplasm of breast: Secondary | ICD-10-CM

## 2022-04-01 ENCOUNTER — Ambulatory Visit
Admission: RE | Admit: 2022-04-01 | Discharge: 2022-04-01 | Disposition: A | Discharge status: Home / Self Care | Payer: Medicare Other | Source: Ambulatory Visit | Attending: Adult Health Nurse Practitioner | Admitting: Adult Health Nurse Practitioner

## 2022-04-01 DIAGNOSIS — Z1231 Encounter for screening mammogram for malignant neoplasm of breast: Secondary | ICD-10-CM

## 2022-04-09 ENCOUNTER — Other Ambulatory Visit (HOSPITAL_COMMUNITY): Payer: Self-pay | Admitting: Adult Health Nurse Practitioner

## 2022-04-09 DIAGNOSIS — M818 Other osteoporosis without current pathological fracture: Secondary | ICD-10-CM

## 2022-04-21 ENCOUNTER — Ambulatory Visit (HOSPITAL_COMMUNITY)
Admission: RE | Admit: 2022-04-21 | Discharge: 2022-04-21 | Disposition: A | Discharge status: Home / Self Care | Payer: Medicare Other | Source: Ambulatory Visit | Attending: Adult Health Nurse Practitioner | Admitting: Adult Health Nurse Practitioner

## 2022-04-21 DIAGNOSIS — M818 Other osteoporosis without current pathological fracture: Secondary | ICD-10-CM | POA: Insufficient documentation

## 2022-05-08 ENCOUNTER — Ambulatory Visit (INDEPENDENT_AMBULATORY_CARE_PROVIDER_SITE_OTHER): Payer: Medicare Other | Admitting: Urology

## 2022-05-08 VITALS — BP 136/77 | HR 96

## 2022-05-08 DIAGNOSIS — N393 Stress incontinence (female) (male): Secondary | ICD-10-CM | POA: Diagnosis not present

## 2022-05-08 DIAGNOSIS — N3010 Interstitial cystitis (chronic) without hematuria: Secondary | ICD-10-CM

## 2022-05-08 DIAGNOSIS — N952 Postmenopausal atrophic vaginitis: Secondary | ICD-10-CM

## 2022-05-08 DIAGNOSIS — G8929 Other chronic pain: Secondary | ICD-10-CM

## 2022-05-08 DIAGNOSIS — R1030 Lower abdominal pain, unspecified: Secondary | ICD-10-CM | POA: Diagnosis not present

## 2022-05-08 LAB — URINALYSIS, ROUTINE W REFLEX MICROSCOPIC
Bilirubin, UA: NEGATIVE
Glucose, UA: NEGATIVE
Ketones, UA: NEGATIVE
Leukocytes,UA: NEGATIVE
Nitrite, UA: NEGATIVE
Protein,UA: NEGATIVE
RBC, UA: NEGATIVE
Specific Gravity, UA: 1.015 (ref 1.005–1.030)
Urobilinogen, Ur: 0.2 mg/dL (ref 0.2–1.0)
pH, UA: 5.5 (ref 5.0–7.5)

## 2022-05-08 LAB — BLADDER SCAN AMB NON-IMAGING: Scan Result: 0

## 2022-05-08 NOTE — Progress Notes (Signed)
Subjective: 1. Chronic interstitial cystitis   2. Chronic suprapubic pain   3. Stress incontinence of urine   4. Vaginal atrophy      Consult requested by Pablo Lawrence NP.  Kelly Scott is a 66 yo female who is sent in consultation for urinary incontinence.  She has a history of IC that began in 2006-7 and was seeing Dr. Rosana Hoes in [place].  She had an HOD and she had DMSO for years.  She took Elmiron for 15 yrs but it became unaffordable.  She is currently on Uribel.  She has some nausea at night.  She tried elavil but had excess sedation.  Her symptoms include burning in the suprapubic area.  She doesn't have dysuria.  She doesn't have any changes in the pain with bladder filling or emptying.  She drinks a lot of water but only has nocturia 1x nightly.  She has some daytime frequency about 1x hourly when at home. She has a good stream and feels she's emptying. She has had no issues with UTI's.  She has some mild SUI but no UUI or urgency.   She has had no hematuria.   She has 2 cups of coffee in the AM but she otherwise tries to avoid dietary irritants.  She had a TAH and BSO in the 1990's and she was Estrogen for 20 years but has been off for about 2 years.  She tried PT in the [place] office in the past.  Her UA is clear today.  ROS:  Review of Systems  HENT:  Positive for congestion.   Gastrointestinal:  Positive for nausea.  Psychiatric/Behavioral:  The patient is nervous/anxious.     Allergies  Allergen Reactions   Amoxicillin-Pot Clavulanate Nausea And Vomiting   Codeine Nausea And Vomiting   Epinephrine Other (See Comments)    Pt reports "I get hot all over and nauseous"    Past Medical History:  Diagnosis Date   Anxiety    Depression    High cholesterol    Insomnia    Seasonal allergies     Past Surgical History:  Procedure Laterality Date   ABDOMINAL HYSTERECTOMY     BREAST EXCISIONAL BIOPSY Left    12 to 13 Years ago    [place] N/A 09/09/2017   Procedure: COLONOSCOPY;  Surgeon: Rogene Houston, MD;  Location: [facility];  Service: Endoscopy;  Laterality: N/A;  830   REFRACTIVE SURGERY     Right toe surgery     WRIST SURGERY Right    TENDON REPAIN     Social History   Socioeconomic History   Marital status: Married    Spouse name: Richardson Landry    Number of children: 2   Years of education: Not on file   Highest education level: Not on file  Occupational History   Not on file  Tobacco Use   Smoking status: Never   Smokeless tobacco: Never  Vaping Use   Vaping Use: Never used  Substance and Sexual Activity   Alcohol use: Yes    Comment: occasionally   Drug use: No   Sexual activity: Yes    Partners: Male    Comment: 1st intercourse-17, partners- 2, married- 3 yrs  Other Topics Concern   Not on file  Social History Narrative   Patient is married and lives at home.    Patient has 2 children and one is  deceased.    Patient has a high school education.      Social Determinants of Health   Financial Resource Strain: Not on file  Food Insecurity: Not on file  Transportation Needs: Not on file  Physical Activity: Not on file  Stress: Not on file  Social Connections: Not on file  Intimate Partner Violence: Not on file    Family History  Problem Relation Age of Onset   Dementia Mother    Hypertension Mother    Emphysema Father    Colon cancer Neg Hx    Breast cancer Neg Hx     Anti-infectives: Anti-infectives (From admission, onward)    None       Current Outpatient Medications  Medication Sig Dispense Refill   ALPRAZolam (XANAX) 0.25 MG tablet Take 0.25 mg by mouth 3 (three) times daily as needed for anxiety.     fexofenadine (ALLEGRA) 180 MG tablet Take 180 mg by mouth daily.     simvastatin (ZOCOR) 10 MG tablet Take 10 mg by mouth daily after supper.      traZODone (DESYREL) 50 MG tablet Take 50 mg by mouth at bedtime.      pentosan polysulfate  (ELMIRON) 100 MG capsule Take 100 mg by mouth 3 (three) times daily. (Patient not taking: Reported on 05/08/2022)     No current facility-administered medications for this visit.     Objective: Vital signs in last 24 hours: BP 136/77   Pulse 96   Intake/Output from previous day: No intake/output data recorded. Intake/Output this shift: '@IOTHISSHIFT'$ @   Physical Exam Vitals reviewed.  Constitutional:      Appearance: Normal appearance.  Cardiovascular:     Rate and Rhythm: Normal rate and regular rhythm.     Heart sounds: Normal heart sounds.  Pulmonary:     Effort: Pulmonary effort is normal. No respiratory distress.     Breath sounds: Normal breath sounds.  Abdominal:     Palpations: Abdomen is soft. There is no mass.     Tenderness: There is no abdominal tenderness.     Hernia: No hernia is present.  Genitourinary:    Comments: Nl external genitalia with mild introital stenosis with mod/severe vaginal atrophy. Nl urethral meatus. Mild urethral hypermobility without incontinence. Bladder is non-tender without mass. Vagina exam demonstrates no prolapse.  There is lateral levator tenderness. Uterus surgically absent. No adnexal mass or tenderness.    Musculoskeletal:        General: No swelling or tenderness. Normal range of motion.  Skin:    General: Skin is warm and dry.  Neurological:     General: No focal deficit present.     Mental Status: She is alert and oriented to person, place, and time.  Psychiatric:        Mood and Affect: Mood normal.        Behavior: Behavior normal.     Lab Results:  Results for orders placed or performed in visit on 05/08/22 (from the past 24 hour(s))  Urinalysis, Routine w reflex microscopic     Status: Abnormal   Collection Time: 05/08/22 10:02 AM  Result Value Ref Range   Specific Gravity, UA 1.015 1.005 - 1.030   pH, UA 5.5 5.0 - 7.5   Color, UA Green (A) Yellow   Appearance Ur Clear Clear   Leukocytes,UA Negative  Negative   Protein,UA Negative Negative/Trace   Glucose, UA Negative Negative   Ketones, UA Negative Negative   RBC, UA Negative Negative  Bilirubin, UA Negative Negative   Urobilinogen, Ur 0.2 0.2 - 1.0 mg/dL   Nitrite, UA Negative Negative   Microscopic Examination Comment    Narrative   Performed at:  [facility] [address] [place], [place]  [id number] Lab Director: [place], Phone:  [phone]    BMET No results for input(s): "NA", "K", "CL", "CO2", "GLUCOSE", "BUN", "CREATININE", "CALCIUM" in the last 72 hours. PT/INR No results for input(s): "LABPROT", "INR" in the last 72 hours. ABG No results for input(s): "PHART", "HCO3" in the last 72 hours.  Invalid input(s): "PCO2", "PO2"  Studies/Results: No results found.   Assessment/Plan: Lower abdominal/suprapubic burning.   I am not sure of the etiology but it doesn't see bladder related.  I discussed consideration of aloe vera since that could help if it is the bladder.  Stress incontinence.  This is very mild and needs no treatment.  Atrophic vaginitis.   She doesn't appear to be symptomatic with this so no treatment is needed.   No orders of the defined types were placed in this encounter.    Orders Placed This Encounter  Procedures   Urinalysis, Routine w reflex microscopic   BLADDER SCAN AMB NON-IMAGING     Return in about 3 months (around 08/07/2022).    CC: Pablo Lawrence NP.     Irine Seal 05/09/2022

## 2022-05-09 ENCOUNTER — Encounter: Payer: Self-pay | Admitting: Urology

## 2022-08-07 ENCOUNTER — Ambulatory Visit: Payer: Medicare Other | Admitting: Urology

## 2022-09-04 ENCOUNTER — Ambulatory Visit: Payer: Medicare Other | Admitting: Urology

## 2023-03-05 ENCOUNTER — Other Ambulatory Visit: Payer: Self-pay | Admitting: Adult Health Nurse Practitioner

## 2023-03-05 DIAGNOSIS — Z1231 Encounter for screening mammogram for malignant neoplasm of breast: Secondary | ICD-10-CM

## 2023-04-07 ENCOUNTER — Ambulatory Visit: Payer: Medicare Other

## 2023-04-20 ENCOUNTER — Ambulatory Visit
Admission: RE | Admit: 2023-04-20 | Discharge: 2023-04-20 | Disposition: A | Discharge status: Home / Self Care | Payer: Medicare Other | Source: Ambulatory Visit | Attending: Adult Health Nurse Practitioner | Admitting: Adult Health Nurse Practitioner

## 2023-04-20 DIAGNOSIS — Z1231 Encounter for screening mammogram for malignant neoplasm of breast: Secondary | ICD-10-CM

## 2024-03-22 ENCOUNTER — Other Ambulatory Visit: Payer: Self-pay | Admitting: Adult Health Nurse Practitioner

## 2024-03-22 DIAGNOSIS — Z1231 Encounter for screening mammogram for malignant neoplasm of breast: Secondary | ICD-10-CM

## 2024-05-23 ENCOUNTER — Ambulatory Visit

## 2024-06-09 ENCOUNTER — Ambulatory Visit
Admission: RE | Admit: 2024-06-09 | Discharge: 2024-06-09 | Disposition: A | Source: Ambulatory Visit | Attending: Adult Health Nurse Practitioner

## 2024-06-09 DIAGNOSIS — Z1231 Encounter for screening mammogram for malignant neoplasm of breast: Secondary | ICD-10-CM
